# Patient Record
Sex: Female | Born: 1990 | Race: Black or African American | Hispanic: No | Marital: Single | Smoking: Current every day smoker
Health system: Southern US, Community
[De-identification: ages and names within clinical notes are randomized; demographics above are authoritative.]

## PROBLEM LIST (undated history)

## (undated) HISTORY — PX: TONSILLECTOMY: SUR1361

---

## 2014-12-27 ENCOUNTER — Emergency Department (HOSPITAL_BASED_OUTPATIENT_CLINIC_OR_DEPARTMENT_OTHER): Payer: Medicaid Other

## 2014-12-27 ENCOUNTER — Encounter (HOSPITAL_BASED_OUTPATIENT_CLINIC_OR_DEPARTMENT_OTHER): Payer: Self-pay | Admitting: *Deleted

## 2014-12-27 ENCOUNTER — Emergency Department (HOSPITAL_BASED_OUTPATIENT_CLINIC_OR_DEPARTMENT_OTHER)
Admission: EM | Admit: 2014-12-27 | Discharge: 2014-12-27 | Disposition: A | Discharge status: Home / Self Care | Payer: Medicaid Other | Attending: Emergency Medicine | Admitting: Emergency Medicine

## 2014-12-27 DIAGNOSIS — R109 Unspecified abdominal pain: Secondary | ICD-10-CM

## 2014-12-27 DIAGNOSIS — Z3202 Encounter for pregnancy test, result negative: Secondary | ICD-10-CM | POA: Insufficient documentation

## 2014-12-27 DIAGNOSIS — R197 Diarrhea, unspecified: Secondary | ICD-10-CM | POA: Diagnosis not present

## 2014-12-27 DIAGNOSIS — R52 Pain, unspecified: Secondary | ICD-10-CM

## 2014-12-27 DIAGNOSIS — R1084 Generalized abdominal pain: Secondary | ICD-10-CM | POA: Diagnosis present

## 2014-12-27 DIAGNOSIS — Z720 Tobacco use: Secondary | ICD-10-CM | POA: Diagnosis not present

## 2014-12-27 LAB — COMPREHENSIVE METABOLIC PANEL
ALBUMIN: 4.1 g/dL (ref 3.5–5.2)
ALT: 18 U/L (ref 0–35)
AST: 17 U/L (ref 0–37)
Alkaline Phosphatase: 65 U/L (ref 39–117)
Anion gap: 6 (ref 5–15)
BUN: 12 mg/dL (ref 6–23)
CALCIUM: 9.1 mg/dL (ref 8.4–10.5)
CO2: 23 mmol/L (ref 19–32)
Chloride: 108 mEq/L (ref 96–112)
Creatinine, Ser: 0.67 mg/dL (ref 0.50–1.10)
GFR calc Af Amer: >90 mL/min (ref >90)
GFR calc non Af Amer: >90 mL/min (ref >90)
GLUCOSE: 90 mg/dL (ref 70–99)
Potassium: 3.3 mmol/L — ABNORMAL LOW (ref 3.5–5.1)
Sodium: 137 mmol/L (ref 135–145)
Total Bilirubin: 0.4 mg/dL (ref 0.3–1.2)
Total Protein: 7.9 g/dL (ref 6.0–8.3)

## 2014-12-27 LAB — CBC WITH DIFFERENTIAL/PLATELET
BASOS ABS: 0 10*3/uL (ref 0.0–0.1)
Basophils Relative: 0 % (ref 0–1)
EOS ABS: 0.2 10*3/uL (ref 0.0–0.7)
EOS PCT: 2 % (ref 0–5)
HCT: 44 % (ref 36.0–46.0)
Hemoglobin: 13.9 g/dL (ref 12.0–15.0)
LYMPHS PCT: 33 % (ref 12–46)
Lymphs Abs: 2.8 10*3/uL (ref 0.7–4.0)
MCH: 24.6 pg — ABNORMAL LOW (ref 26.0–34.0)
MCHC: 31.6 g/dL (ref 30.0–36.0)
MCV: 78 fL (ref 78.0–100.0)
Monocytes Absolute: 0.6 10*3/uL (ref 0.1–1.0)
Monocytes Relative: 7 % (ref 3–12)
Neutro Abs: 5 10*3/uL (ref 1.7–7.7)
Neutrophils Relative %: 58 % (ref 43–77)
PLATELETS: 280 10*3/uL (ref 150–400)
RBC: 5.64 MIL/uL — ABNORMAL HIGH (ref 3.87–5.11)
RDW: 14.4 % (ref 11.5–15.5)
WBC: 8.6 10*3/uL (ref 4.0–10.5)

## 2014-12-27 LAB — URINALYSIS, ROUTINE W REFLEX MICROSCOPIC
Bilirubin Urine: NEGATIVE
Glucose, UA: NEGATIVE mg/dL
Hgb urine dipstick: NEGATIVE
KETONES UR: NEGATIVE mg/dL
Nitrite: NEGATIVE
PH: 5.5 (ref 5.0–8.0)
Protein, ur: NEGATIVE mg/dL
Specific Gravity, Urine: 1.027 (ref 1.005–1.030)
Urobilinogen, UA: 0.2 mg/dL (ref 0.0–1.0)

## 2014-12-27 LAB — URINE MICROSCOPIC-ADD ON

## 2014-12-27 LAB — LIPASE, BLOOD: LIPASE: 25 U/L (ref 11–59)

## 2014-12-27 LAB — PREGNANCY, URINE: Preg Test, Ur: NEGATIVE

## 2014-12-27 MED ORDER — POTASSIUM CHLORIDE CRYS ER 20 MEQ PO TBCR
40.0000 meq | EXTENDED_RELEASE_TABLET | Freq: Once | ORAL | Status: AC
  Administered 2014-12-27: 40 meq via ORAL
  Filled 2014-12-27: qty 2

## 2014-12-27 NOTE — ED Notes (Signed)
Pt c/o abd pain n/v/d x 6 days

## 2014-12-27 NOTE — ED Notes (Signed)
Patient transported to X-ray 

## 2014-12-27 NOTE — ED Provider Notes (Signed)
CSN: [id number]     Arrival date & time 12/27/14  1605 History   First MD Initiated Contact with Patient 12/27/14 1615     Chief Complaint  Patient presents with  . Abdominal Pain     (Consider location/radiation/quality/duration/timing/severity/associated sxs/prior Treatment) HPI Presents with abdominal pain diffuse onset 5 days ago crampy last 5 minutes at a time comes approximate 3 or 4 times per day. No nausea or vomiting no anorexia no appetite changes no fever she's had 4 or 5 episodes of watery diarrhea today no diarrhea prior to today. No treatment prior to coming here. Pain is made worse with eating improved with lying supine no other associated symptoms pain is minimal at present. No recent travel or recent antibiotic use History reviewed. No pertinent past medical history. past medical history negative History reviewed. No pertinent past surgical history. surgical history adenoidectomy, uvulectomy History reviewed. No pertinent family history. History  Substance Use Topics  . Smoking status: Current Every Day Smoker -- 0.50 packs/day    Types: Cigarettes  . Smokeless tobacco: Not on file  . Alcohol Use: Not on file   occasional alcohol no illicit drug use OB History    No data available     Review of Systems  Constitutional: Negative.   HENT: Negative.   Respiratory: Negative.   Cardiovascular: Negative.   Gastrointestinal: Positive for abdominal pain and diarrhea.  Genitourinary:       Amenorrheic has implanted subcutaneous birth control device  Musculoskeletal: Negative.   Skin: Negative.   Neurological: Negative.   Psychiatric/Behavioral: Negative.   All other systems reviewed and are negative.     Allergies  Review of patient's allergies indicates no known allergies.  Home Medications   Prior to Admission medications   Not on File   BP 127/65 mmHg  Pulse 97  Temp(Src) 98.5 F (36.9 C) (Oral)  Resp 16  Ht 5' 9.5" (1.765 m)  Wt 295 lb (133.811 kg)   BMI 42.95 kg/m2  SpO2 100% Physical Exam  Constitutional: She appears well-developed and well-nourished.  HENT:  Head: Normocephalic and atraumatic.  Eyes: Conjunctivae are normal. Pupils are equal, round, and reactive to light.  Neck: Neck supple. No tracheal deviation present. No thyromegaly present.  Cardiovascular: Normal rate and regular rhythm.   No murmur heard. Pulmonary/Chest: Effort normal and breath sounds normal.  Abdominal: Soft. Bowel sounds are normal. She exhibits no distension. There is no tenderness.  Morbidly obese  Musculoskeletal: Normal range of motion. She exhibits no edema or tenderness.  Neurological: She is alert. Coordination normal.  Skin: Skin is warm and dry. No rash noted.  Psychiatric: She has a normal mood and affect.  Nursing note and vitals reviewed.   ED Course  Procedures (including critical care time) Labs Review Labs Reviewed  URINALYSIS, ROUTINE W REFLEX MICROSCOPIC  PREGNANCY, URINE    Imaging Review No results found.   EKG Interpretation None     5:40 PM patient resting comfortably. Asymptomatic. Results for orders placed or performed during the hospital encounter of 12/27/14  Urinalysis, Routine w reflex microscopic  Result Value Ref Range   Color, Urine YELLOW YELLOW   APPearance CLOUDY (A) CLEAR   Specific Gravity, Urine 1.027 1.005 - 1.030   pH 5.5 5.0 - 8.0   Glucose, UA NEGATIVE NEGATIVE mg/dL   Hgb urine dipstick NEGATIVE NEGATIVE   Bilirubin Urine NEGATIVE NEGATIVE   Ketones, ur NEGATIVE NEGATIVE mg/dL   Protein, ur NEGATIVE NEGATIVE mg/dL   Urobilinogen, UA  0.2 0.0 - 1.0 mg/dL   Nitrite NEGATIVE NEGATIVE   Leukocytes, UA TRACE (A) NEGATIVE  Pregnancy, urine  Result Value Ref Range   Preg Test, Ur NEGATIVE NEGATIVE  Comprehensive metabolic panel  Result Value Ref Range   Sodium 137 135 - 145 mmol/L   Potassium 3.3 (L) 3.5 - 5.1 mmol/L   Chloride 108 96 - 112 mEq/L   CO2 23 19 - 32 mmol/L   Glucose, Bld 90  70 - 99 mg/dL   BUN 12 6 - 23 mg/dL   Creatinine, Ser 4.090.67 0.50 - 1.10 mg/dL   Calcium 9.1 8.4 - 81.110.5 mg/dL   Total Protein 7.9 6.0 - 8.3 g/dL   Albumin 4.1 3.5 - 5.2 g/dL   AST 17 0 - 37 U/L   ALT 18 0 - 35 U/L   Alkaline Phosphatase 65 39 - 117 U/L   Total Bilirubin 0.4 0.3 - 1.2 mg/dL   GFR calc non Af Amer >90 >90 mL/min   GFR calc Af Amer >90 >90 mL/min   Anion gap 6 5 - 15  CBC with Differential  Result Value Ref Range   WBC 8.6 4.0 - 10.5 K/uL   RBC 5.64 (H) 3.87 - 5.11 MIL/uL   Hemoglobin 13.9 12.0 - 15.0 g/dL   HCT 91.444.0 78.236.0 - 95.646.0 %   MCV 78.0 78.0 - 100.0 fL   MCH 24.6 (L) 26.0 - 34.0 pg   MCHC 31.6 30.0 - 36.0 g/dL   RDW 21.314.4 08.611.5 - 57.815.5 %   Platelets 280 150 - 400 K/uL   Neutrophils Relative % 58 43 - 77 %   Neutro Abs 5.0 1.7 - 7.7 K/uL   Lymphocytes Relative 33 12 - 46 %   Lymphs Abs 2.8 0.7 - 4.0 K/uL   Monocytes Relative 7 3 - 12 %   Monocytes Absolute 0.6 0.1 - 1.0 K/uL   Eosinophils Relative 2 0 - 5 %   Eosinophils Absolute 0.2 0.0 - 0.7 K/uL   Basophils Relative 0 0 - 1 %   Basophils Absolute 0.0 0.0 - 0.1 K/uL  Lipase, blood  Result Value Ref Range   Lipase 25 11 - 59 U/L  Urine microscopic-add on  Result Value Ref Range   Squamous Epithelial / LPF FEW (A) RARE   WBC, UA 0-2 <3 WBC/hpf   Bacteria, UA RARE RARE   Dg Abd Acute W/chest  12/27/2014   CLINICAL DATA:  Patient states that her stomach has been bothering her x 6 days; worse when she eats; diarrhea but patient says no vomiting  EXAM: ACUTE ABDOMEN SERIES (ABDOMEN 2 VIEW & CHEST 1 VIEW)  COMPARISON:  None.  FINDINGS: There is no evidence of dilated bowel loops or free intraperitoneal air. No radiopaque calculi or other significant radiographic abnormality is seen. Heart size and mediastinal contours are within normal limits. Both lungs are clear.  IMPRESSION: Negative abdominal radiographs.  No acute cardiopulmonary disease.   Electronically Signed   By: Elige KoHetal  Patel   On: 12/27/2014 16:58     MDM  Suggest Maalox, Zantac avoid dairy products all having diarrhea as pain worse with eating. No signs of cholecystitis.  Referral to resource guide to get primary care physician. Diagnosis #1 abdominal pain #2 diarrhea #3 hypokalemia Final diagnoses:  None        Doug SouSam Noa Galvao, MD 12/27/14 1751

## 2014-12-27 NOTE — Discharge Instructions (Signed)
Abdominal Pain, Women Take Maalox 2 tablespoons after meals and at bedtime. He can also use Zantac as directed. Call any of the numbers on the resource guide to get a primary care physician and to arrange to be seen if having significant discomfort in one week. Avoid milk or foods containing milk such as cheese or ice cream while having diarrhea Abdominal (stomach, pelvic, or belly) pain can be caused by many things. It is important to tell your doctor:  The location of the pain.  Does it come and go or is it present all the time?  Are there things that start the pain (eating certain foods, exercise)?  Are there other symptoms associated with the pain (fever, nausea, vomiting, diarrhea)? All of this is helpful to know when trying to find the cause of the pain. CAUSES   Stomach: virus or bacteria infection, or ulcer.  Intestine: appendicitis (inflamed appendix), regional ileitis (Crohn's disease), ulcerative colitis (inflamed colon), irritable bowel syndrome, diverticulitis (inflamed diverticulum of the colon), or cancer of the stomach or intestine.  Gallbladder disease or stones in the gallbladder.  Kidney disease, kidney stones, or infection.  Pancreas infection or cancer.  Fibromyalgia (pain disorder).  Diseases of the female organs:  Uterus: fibroid (non-cancerous) tumors or infection.  Fallopian tubes: infection or tubal pregnancy.  Ovary: cysts or tumors.  Pelvic adhesions (scar tissue).  Endometriosis (uterus lining tissue growing in the pelvis and on the pelvic organs).  Pelvic congestion syndrome (female organs filling up with blood just before the menstrual period).  Pain with the menstrual period.  Pain with ovulation (producing an egg).  Pain with an IUD (intrauterine device, birth control) in the uterus.  Cancer of the female organs.  Functional pain (pain not caused by a disease, may improve without treatment).  Psychological  pain.  Depression. DIAGNOSIS  Your doctor will decide the seriousness of your pain by doing an examination.  Blood tests.  X-rays.  Ultrasound.  CT scan (computed tomography, special type of X-ray).  MRI (magnetic resonance imaging).  Cultures, for infection.  Barium enema (dye inserted in the large intestine, to better view it with X-rays).  Colonoscopy (looking in intestine with a lighted tube).  Laparoscopy (minor surgery, looking in abdomen with a lighted tube).  Major abdominal exploratory surgery (looking in abdomen with a large incision). TREATMENT  The treatment will depend on the cause of the pain.   Many cases can be observed and treated at home.  Over-the-counter medicines recommended by your caregiver.  Prescription medicine.  Antibiotics, for infection.  Birth control pills, for painful periods or for ovulation pain.  Hormone treatment, for endometriosis.  Nerve blocking injections.  Physical therapy.  Antidepressants.  Counseling with a psychologist or psychiatrist.  Minor or major surgery. HOME CARE INSTRUCTIONS   Do not take laxatives, unless directed by your caregiver.  Take over-the-counter pain medicine only if ordered by your caregiver. Do not take aspirin because it can cause an upset stomach or bleeding.  Try a clear liquid diet (broth or water) as ordered by your caregiver. Slowly move to a bland diet, as tolerated, if the pain is related to the stomach or intestine.  Have a thermometer and take your temperature several times a day, and record it.  Bed rest and sleep, if it helps the pain.  Avoid sexual intercourse, if it causes pain.  Avoid stressful situations.  Keep your follow-up appointments and tests, as your caregiver orders.  If the pain does not go away  with medicine or surgery, you may try:  Acupuncture.  Relaxation exercises (yoga, meditation).  Group therapy.  Counseling. SEEK MEDICAL CARE IF:   You  notice certain foods cause stomach pain.  Your home care treatment is not helping your pain.  You need stronger pain medicine.  You want your IUD removed.  You feel faint or lightheaded.  You develop nausea and vomiting.  You develop a rash.  You are having side effects or an allergy to your medicine. SEEK IMMEDIATE MEDICAL CARE IF:   Your pain does not go away or gets worse.  You have a fever.  Your pain is felt only in portions of the abdomen. The right side could possibly be appendicitis. The left lower portion of the abdomen could be colitis or diverticulitis.  You are passing blood in your stools (bright red or black tarry stools, with or without vomiting).  You have blood in your urine.  You develop chills, with or without a fever.  You pass out. MAKE SURE YOU:   Understand these instructions.  Will watch your condition.  Will get help right away if you are not doing well or get worse. Document Released: 09/20/2007 Document Revised: 04/09/2014 Document Reviewed: 10/10/2009 [facility] Patient Information 2015 [place], [place]. This information is not intended to replace advice given to you by your health care provider. Make sure you discuss any questions you have with your health care provider.  Emergency Department Resource Guide 1) Find a Doctor and Pay Out of Pocket Although you won't have to find out who is covered by your insurance plan, it is a good idea to ask around and get recommendations. You will then need to call the office and see if the doctor you have chosen will accept you as a new patient and what types of options they offer for patients who are self-pay. Some doctors offer discounts or will set up payment plans for their patients who do not have insurance, but you will need to ask so you aren't surprised when you get to your appointment.  2) Contact Your Local Health Department Not all health departments have doctors that can see patients for sick  visits, but many do, so it is worth a call to see if yours does. If you don't know where your local health department is, you can check in your phone book. The CDC also has a tool to help you locate your state's health department, and many state websites also have listings of all of their local health departments.  3) Find a Walk-in Clinic If your illness is not likely to be very severe or complicated, you may want to try a walk in clinic. These are popping up all over the country in pharmacies, drugstores, and shopping centers. They're usually staffed by nurse practitioners or physician assistants that have been trained to treat common illnesses and complaints. They're usually fairly quick and inexpensive. However, if you have serious medical issues or chronic medical problems, these are probably not your best option.  No Primary Care Doctor: - Call Health Connect at  [phone] - they can help you locate a primary care doctor that  accepts your insurance, provides certain services, etc. - Physician Referral Service- [phone]  Chronic Pain Problems: Organization         Address  Phone   Notes  [facility]  [phone] Patients need to be referred by their primary care doctor.   Medication Assistance: Organization  Address  Phone   Notes  [facility] Medication [facility] [address] [place], [place] [address] [phone] --Must be a resident of [facility] -- Must have NO insurance coverage whatsoever (no Medicaid/ Medicare, etc.) -- The pt. MUST have a primary care doctor that directs their care regularly and follows them in the community   [facility]ist  [phone]   [employer]  [phone]    Agencies that provide inexpensive medical care: Organization         Address  Phone   Notes  [place]  [phone]   [facility]    [phone]   [facility] [place], [place] [address] [phone]   [place] [address] [place] [phone]   Planned Parenthood    [phone]   [facility]    [phone]   [place] and [address], [place] Phone:  [phone], Fax:  [phone] Hours of Operation:  9 am - 6 pm, M-F.  Also accepts Medicaid/Medicare and self-pay.  [facility] for [place] [address], [address], [place] Phone: [phone], Fax: [phone]. Hours of Operation:  8:30 am - 5:30 pm, M-F.  Also accepts Medicaid and self-pay.  [facility] [address], [place] Phone: [phone]   [place], [place], [place] [phone], Ext. [phone] Mondays & Thursdays: 7-9 AM.  First 15 patients are seen on a first come, first serve basis.    [place] Providers:  Organization         Address  Phone   Notes  [facility] [address], [place] [phone] Also accepts self-pay patients.  [facility] [address], [place]  [phone]   [place], [address], [place] [phone]   [facility] [address], [place] [phone]   Lucianne Lei [address], [place]   [phone] Only accepts [place] Access [place] patients after they have their name applied to their card.   Self-Pay (no insurance) in [facility]:  Organization         Address  Phone   Notes  Sickle Cell Patients, [facility] [place] [phone]   [facility] Urgent Care [place] [phone]   [facility] [place]  [place], [place], [place] [phone]   [facility]/Dr. Osei-Bonsu  [address]  [place] or [address], [place] [phone] Phone number for both [place] and [place] locations is the same.  Urgent Medical and [facility] [address] [place] [phone]   [facility] [address] [place] or [address] [phone]   [facility] [address] [place] [phone], phone; [phone], fax Sees patients 1st and 3rd Saturday of every month.  Must not qualify for public or private insurance (i.e. Medicaid, Medicare, [place] Health Choice, Veterans' Benefits)  Household income should be no more than 200% of the poverty level The clinic cannot treat you if you are pregnant or think you  are pregnant  Sexually transmitted diseases are not treated at the clinic.    Dental Care: Organization         Address  Phone  Notes  [facility] Department of [facility] [place] [phone] Accepts children up to age 60 who are enrolled in [place] or [place]; pregnant women with a Medicaid card; and children who have applied for Medicaid or [place] Health Choice, but were declined, whose parents can pay a reduced fee at time of service.  [facility] Department of [facility]  [address], [place] [phone] Accepts children up to age 43 who are enrolled in [place] or [place]; pregnant women with a Medicaid card; and children who have applied for Medicaid or [place] Health Choice, but were declined, whose parents can pay a reduced fee at time of service.  [place] Adult Dental Access PROGRAM  [place] [phone] Patients are seen by appointment only. Walk-ins are not accepted. [place] will see patients 47 years of age and older. Monday - Tuesday (8am-5pm) Most Wednesdays (8:30-5pm) $30 per visit, cash only  [facility] Adult Dental Access PROGRAM  [address]  Dr, [facility] [phone] Patients are seen by appointment only. Walk-ins are not accepted. [place] will see patients 27 years of age and older. One Wednesday Evening (Monthly: Volunteer Based).  $30 per visit, cash only  [place]  [phone] for adults; Children under age 24, call Graduate Pediatric Dentistry at [phone]. Children aged 14-14, please call [phone] to request a pediatric application.  Dental services are provided in all areas of dental care including fillings, crowns and bridges, complete and partial dentures, implants, gum treatment, root canals, and extractions. Preventive care is also provided. Treatment is provided to both adults and children. Patients are selected via a lottery and there is often a waiting list.   [facility] [address], [place]  [phone] [email or web address]   [facility] [address] [place], [place] [phone], Ext. [phone] Second and Fourth Thursday of each month, opens at 6:30 AM; Clinic ends at 9 AM.  Patients are seen on a first-come first-served basis, and a limited number are seen during each clinic.   [facility]  [address] Hillard Danker [place], [place] [phone]   Eligibility Requirements You must have lived in [place], [place], or [place] counties for at least the last three months.   You cannot be eligible for state or federal sponsored [employer], including [employer], [place], or [employer].   You generally cannot be eligible for healthcare insurance through your employer.    How to apply: Eligibility screenings are held every Tuesday and Wednesday afternoon from 1:00 pm until 4:00 pm. You do not need an appointment for the interview!  [facility] [address] [place], [place]   [place]  [place] Department  [place]  [phone]    Behavioral Health Resources in the Community: Intensive Outpatient Programs Organization         Address  Phone  Notes  [facility] [place]. [address] [place], [place] [phone]   [facility] Outpatient [address] [place], [place]   ADS: Alcohol & Drug Svcs [address]  Dr, [place], [place]   [place] [address],  [place], [place] or [phone]   Substance Abuse Resources Organization         Address  Phone  Notes  Alcohol and Drug Services  [phone]   [place]  [phone]   [facility]   Chinita Pester  [phone]   Residential & Outpatient Substance Abuse Program  [phone]   Psychological Services Organization         Address  Phone  Notes  [facility]  [place]  [phone]   [place] [address], [place] or [phone]    Mobile Crisis Teams Organization         Address  Phone  Notes  [facility], Mobile Crisis Care Unit  [phone]   [facility]  [address] [place], [place]   [place] [address], [place] [phone]    Self-Help/Support Groups Organization         Address  Phone             Notes  [place]. of [place] - variety of support groups  [place] Call for more information  Narcotics Anonymous (NA), [facility] [address], [employer] [place]  2 meetings at this location   Special educational needs teacher         Address  Phone  Notes  [facility]AP Residential Treatment [place],    [place]  [phone]   [facility]  [address] [place] [address], [place], [place]   Ewing Worden, [place]  [phone] Admissions: 8am-3pm M-F  Incentives Substance [place] [address],    [place], [place] [phone]   [facility] [address] [place], [place], [place]   [facility] [address]  [place], [place]   [facility] - Intensive Outpatient [address] Kristeen [address], [place], [place]   [facility] ([place] [address].,  [place], [place] [phone] or [phone]   [facility] Services (RT[facility]) [address], [place], [place] Accepts Medicaid  [facility] [address]  [place] [phone] Substance Abuse/Addiction Treatment   [facility] Organization         Address  Phone  Notes  [facility]  [phone]   Domenic Schwab, PhD [address] Arlis Porta [place], [place]   [phone] or [phone]   [place], [place] [phone]   [facility] [address] [place], [place] [phone] Insurance/Medicaid/sponsorship through [facility] and Families [address]                                    [place], [place] [phone] [place] [address] [place], [place] [phone]    Dr. Adele Schilder  [phone]   [facility] Dept. 1) [address] [place] 2) [place] 3)  [place] [address], [place] [phone]  [phone]   [place] [phone])  [phone] or [phone] (After Hours)

## 2015-10-28 ENCOUNTER — Encounter (HOSPITAL_BASED_OUTPATIENT_CLINIC_OR_DEPARTMENT_OTHER): Payer: Self-pay | Admitting: *Deleted

## 2015-10-28 ENCOUNTER — Emergency Department (HOSPITAL_BASED_OUTPATIENT_CLINIC_OR_DEPARTMENT_OTHER): Payer: Medicaid Other

## 2015-10-28 ENCOUNTER — Emergency Department (HOSPITAL_BASED_OUTPATIENT_CLINIC_OR_DEPARTMENT_OTHER)
Admission: EM | Admit: 2015-10-28 | Discharge: 2015-10-28 | Disposition: A | Discharge status: Home / Self Care | Payer: Medicaid Other | Attending: Emergency Medicine | Admitting: Emergency Medicine

## 2015-10-28 DIAGNOSIS — Z3202 Encounter for pregnancy test, result negative: Secondary | ICD-10-CM | POA: Diagnosis not present

## 2015-10-28 DIAGNOSIS — R05 Cough: Secondary | ICD-10-CM | POA: Insufficient documentation

## 2015-10-28 DIAGNOSIS — R079 Chest pain, unspecified: Secondary | ICD-10-CM | POA: Insufficient documentation

## 2015-10-28 DIAGNOSIS — Z880 Allergy status to penicillin: Secondary | ICD-10-CM | POA: Insufficient documentation

## 2015-10-28 DIAGNOSIS — R0989 Other specified symptoms and signs involving the circulatory and respiratory systems: Secondary | ICD-10-CM | POA: Diagnosis not present

## 2015-10-28 DIAGNOSIS — F17210 Nicotine dependence, cigarettes, uncomplicated: Secondary | ICD-10-CM | POA: Insufficient documentation

## 2015-10-28 DIAGNOSIS — R059 Cough, unspecified: Secondary | ICD-10-CM

## 2015-10-28 LAB — PREGNANCY, URINE: Preg Test, Ur: NEGATIVE

## 2015-10-28 MED ORDER — BENZONATATE 100 MG PO CAPS: 100.0000 mg | ORAL_CAPSULE | Freq: Two times a day (BID) | ORAL | Status: DC | PRN

## 2015-10-28 NOTE — ED Provider Notes (Signed)
CSN: [id number]     Arrival date & time 10/28/15  [id number] History   First MD Initiated Contact with Patient 10/28/15 [phone]     Chief Complaint  Patient presents with  . Cough     (Consider location/radiation/quality/duration/timing/severity/associated sxs/prior Treatment) HPI 24 year old female who presents with cough for 3-5 days. Says she feels normal otherwise, but coughing consistently with chest wall soreness. Having significant amount of brown mucous and chest congestion. Continues to smoke while this is ongoing. Denies N/V, fevers, night sweats, diarrhea, abdominal pain, dysuria. Works [employer], so exposed to sick contacts likely per patient. No recent travel.    History reviewed. No pertinent past medical history. History reviewed. No pertinent past surgical history. History reviewed. No pertinent family history. Social History  Substance Use Topics  . Smoking status: Current Every Day Smoker -- 0.50 packs/day    Types: Cigarettes  . Smokeless tobacco: None  . Alcohol Use: None   OB History    No data available     Review of Systems 10/14 systems reviewed and are negative other than those stated in the HPI    Allergies  Penicillins and Zithromax  Home Medications   Prior to Admission medications   Medication Sig Start Date End Date Taking? Authorizing Provider  benzonatate (TESSALON PERLES) 100 MG capsule Take 1 capsule (100 mg total) by mouth 2 (two) times daily as needed for cough. 10/28/15   Lavera Guiseana Duo Arlan Birks, MD   BP 119/71 mmHg  Pulse 71  Temp(Src) 98.7 F (37.1 C) (Oral)  Resp 18  Ht 5' 9.5" (1.765 m)  Wt 302 lb (136.986 kg)  BMI 43.97 kg/m2  SpO2 100% Physical Exam Physical Exam  Nursing note and vitals reviewed. Constitutional: Well developed, well nourished, non-toxic, and in no acute distress Head: Normocephalic and atraumatic.  Mouth/Throat: Oropharynx is clear and moist.  Neck: Normal range of motion. Neck supple.  Cardiovascular:  Normal rate and regular rhythm.   Pulmonary/Chest: Effort normal and breath sounds normal. Anterior chest wall tenderness to palpation. Abdominal: Soft. There is no tenderness. There is no rebound and no guarding.  Musculoskeletal: Normal range of motion.  Neurological: Alert, no facial droop, fluent speech, moves all extremities symmetrically Skin: Skin is warm and dry.  Psychiatric: Cooperative  ED Course  Procedures (including critical care time) Labs Review Labs Reviewed  PREGNANCY, URINE    Imaging Review Dg Chest 2 View  10/28/2015  CLINICAL DATA:  Cough, congestion for 1 week EXAM: CHEST  2 VIEW COMPARISON:  09/05/2015 FINDINGS: Cardiomediastinal silhouette is stable. No acute infiltrate or pleural effusion. No pulmonary edema. Bony thorax is unremarkable. IMPRESSION: No active cardiopulmonary disease. Electronically Signed   By: Natasha MeadLiviu  Pop M.D.   On: 10/28/2015 09:25   I have personally reviewed and evaluated these images and lab results as part of my medical decision-making.   EKG Interpretation   Date/Time:  Monday October 28 2015 08:23:48 EST Ventricular Rate:  83 PR Interval:  152 QRS Duration: 86 QT Interval:  360 QTC Calculation: 423 R Axis:   62 Text Interpretation:  Normal sinus rhythm Normal ECG Confirmed by Erandy Mceachern MD,  Annabelle HarmanANA ([id number]) on 10/28/2015 9:47:14 AM      MDM   Final diagnoses:  Cough      24 year old female who presents with 3-5 days of cough, congestion, sputum production, and chest wall discomfort. Well-appearing and in no acute distress. Breathing comfortably on room air with no conversational dyspnea. Vital signs are  within normal limits. Lungs are clear to auscultation bilaterally, and she has some reproducible chest wall discomfort anteriorly. PERC negative, and I'm not concerned for PE. Chest x-ray showing no acute cardiopulmonary processes. Likely viral respiratory illness in nature. Discussed supportive care instructions for home. Strict  return and follow-up instructions are reviewed. She expressed understanding of all discharge instructions and felt comfortable to plan of care.   Lavera Guise, MD 10/28/15 [phone]

## 2015-10-28 NOTE — ED Notes (Signed)
Dr. Verdie MosherLiu at bedside to discuss d/c with patient. Pt requested medication for cough and EDP brought rx for tessalon. States "I don't even want that" and threw rx to the side. Reviewed d/c instructions with pt including importance of not smoking. Pt given d/c resource guide for followup.

## 2015-10-28 NOTE — ED Notes (Signed)
Patient transported to X-ray 

## 2015-10-28 NOTE — ED Notes (Signed)
MD at bedside. 

## 2015-10-28 NOTE — ED Notes (Signed)
Pt reports cough x 3 days, denies any fevers, coughing up brownish sputum, pt is daily smoker. Denies any nasal congestion, states she had pneumonia in September.

## 2015-10-28 NOTE — Discharge Instructions (Signed)
Your chest x-ray does not show a pneumonia. It is likely a virus that is causing her symptoms. Smoking is likely making her symptoms worse, so please cut down or quit if possible. Your symptoms may last up to 4 weeks. Return without fail for worsening symptoms including difficulty breathing, worsening pain, fever or any other symptoms concerning to you.  Cough, Adult A cough helps to clear your throat and lungs. A cough may last only 2-3 weeks (acute), or it may last longer than 8 weeks (chronic). Many different things can cause a cough. A cough may be a sign of an illness or another medical condition. HOME CARE  Pay attention to any changes in your cough.  Take medicines only as told by your doctor.  If you were prescribed an antibiotic medicine, take it as told by your doctor. Do not stop taking it even if you start to feel better.  Talk with your doctor before you try using a cough medicine.  Drink enough fluid to keep your pee (urine) clear or pale yellow.  If the air is dry, use a cold steam vaporizer or humidifier in your home.  Stay away from things that make you cough at work or at home.  If your cough is worse at night, try using extra pillows to raise your head up higher while you sleep.  Do not smoke, and try not to be around smoke. If you need help quitting, ask your doctor.  Do not have caffeine.  Do not drink alcohol.  Rest as needed. GET HELP IF:  You have new problems (symptoms).  You cough up yellow fluid (pus).  Your cough does not get better after 2-3 weeks, or your cough gets worse.  Medicine does not help your cough and you are not sleeping well.  You have pain that gets worse or pain that is not helped with medicine.  You have a fever.  You are losing weight and you do not know why.  You have night sweats. GET HELP RIGHT AWAY IF:  You cough up blood.  You have trouble breathing.  Your heartbeat is very fast.   This information is not  intended to replace advice given to you by your health care provider. Make sure you discuss any questions you have with your health care provider.   Document Released: 08/06/2011 Document Revised: 08/14/2015 Document Reviewed: 01/30/2015 Elsevier Interactive Patient Education [employer]2016 Elsevier Inc.

## 2015-10-28 NOTE — ED Notes (Signed)
RN at bedside for triage. 

## 2016-11-06 ENCOUNTER — Encounter (HOSPITAL_BASED_OUTPATIENT_CLINIC_OR_DEPARTMENT_OTHER): Payer: Self-pay | Admitting: Emergency Medicine

## 2016-11-06 ENCOUNTER — Emergency Department (HOSPITAL_BASED_OUTPATIENT_CLINIC_OR_DEPARTMENT_OTHER)
Admission: EM | Admit: 2016-11-06 | Discharge: 2016-11-06 | Disposition: A | Discharge status: Home / Self Care | Payer: Medicaid Other | Attending: Emergency Medicine | Admitting: Emergency Medicine

## 2016-11-06 ENCOUNTER — Emergency Department (HOSPITAL_BASED_OUTPATIENT_CLINIC_OR_DEPARTMENT_OTHER): Payer: Medicaid Other

## 2016-11-06 DIAGNOSIS — F17210 Nicotine dependence, cigarettes, uncomplicated: Secondary | ICD-10-CM | POA: Insufficient documentation

## 2016-11-06 DIAGNOSIS — M79671 Pain in right foot: Secondary | ICD-10-CM | POA: Diagnosis present

## 2016-11-06 DIAGNOSIS — M722 Plantar fascial fibromatosis: Secondary | ICD-10-CM | POA: Insufficient documentation

## 2016-11-06 MED ORDER — IBUPROFEN 800 MG PO TABS: 800.0000 mg | ORAL_TABLET | Freq: Three times a day (TID) | ORAL | 0 refills | Status: DC

## 2016-11-06 NOTE — ED Triage Notes (Signed)
Patient states that for the last 2- 3 weeks she has had pain to the arch of her right foot. Patient reports sharp pains

## 2016-11-06 NOTE — ED Provider Notes (Signed)
[facility] Provider Note   CSN: [id number] Arrival date & time: 11/06/16  1823  By signing my name below, I, Linna DarnerRussell Turner, attest that this documentation has been prepared under the direction and in the presence of non-physician practitioner, Buel ReamAlexandra Daniah Zaldivar, PA-C. Electronically Signed: Linna Darnerussell Turner, Scribe. 11/06/2016. 8:31 PM.  History   Chief Complaint Chief Complaint  Patient presents with  . Foot Pain    The history is provided by the patient. No language interpreter was used.     HPI Comments: Terri Robbins is a 25 y.o. female who presents to the Emergency Department complaining of gradual onset, constant, burning, aching, right foot pain for the last several weeks. She states the pain is most significant in her right heel. She reports associated swelling. Pt endorses pain exacerbation with standing, ambulating, and bearing weight on her right foot. She notes her pain is worse when her right foot is bare. Pt reports she stands on her feet often at work and has worn the same work shoes for a long time. No medications or treatments tried. She notes she saw a podiatrist about a year and half ago for the same reason; she states the pain resolved completely until a few weeks ago. She denies numbness/tingling, wounds, rash, color change, fever, chills, chest pain, SOB, nausea, vomiting, abdominal pain, or any other associated symptoms.  History reviewed. No pertinent past medical history.  There are no active problems to display for this patient.   History reviewed. No pertinent surgical history.  OB History    No data available       Home Medications    Prior to Admission medications   Medication Sig Start Date End Date Taking? Authorizing Provider  benzonatate (TESSALON PERLES) 100 MG capsule Take 1 capsule (100 mg total) by mouth 2 (two) times daily as needed for cough. 10/28/15   Lavera Guiseana Duo Liu, MD  ibuprofen (ADVIL,MOTRIN) 800 MG tablet Take 1 tablet (800  mg total) by mouth 3 (three) times daily. 11/06/16   Emi HolesAlexandra M Beckam Abdulaziz, PA-C    Family History History reviewed. No pertinent family history.  Social History Social History  Substance Use Topics  . Smoking status: Current Every Day Smoker    Packs/day: 0.50    Types: Cigarettes  . Smokeless tobacco: Never Used  . Alcohol use Not on file     Allergies   Penicillins and Zithromax [azithromycin]   Review of Systems Review of Systems  Constitutional: Negative for chills and fever.  Respiratory: Negative for shortness of breath.   Cardiovascular: Negative for chest pain.  Gastrointestinal: Negative for abdominal pain, nausea and vomiting.  Musculoskeletal: Positive for joint swelling (right foot) and myalgias (right foot). Negative for back pain.  Skin: Negative for color change, rash and wound.  Neurological: Negative for numbness.  Psychiatric/Behavioral: The patient is not nervous/anxious.      Physical Exam Updated Vital Signs BP 140/87 (BP Location: Right Arm)   Pulse 102   Temp 98.3 F (36.8 C) (Oral)   Resp 18   Ht 5\' 9"  (1.753 m)   Wt (!) 142.9 kg   LMP 09/06/2016   SpO2 100%   BMI 46.52 kg/m   Physical Exam  Constitutional: She appears well-developed and well-nourished. No distress.  HENT:  Head: Normocephalic and atraumatic.  Mouth/Throat: Oropharynx is clear and moist. No oropharyngeal exudate.  Eyes: Conjunctivae are normal. Pupils are equal, round, and reactive to light. Right eye exhibits no discharge. Left eye exhibits no discharge. No  scleral icterus.  Neck: Normal range of motion. Neck supple. No thyromegaly present.  Cardiovascular: Regular rhythm, normal heart sounds and intact distal pulses.  Exam reveals no gallop and no friction rub.   No murmur heard. Pulmonary/Chest: Effort normal and breath sounds normal. No stridor. No respiratory distress. She has no wheezes. She has no rales.  Abdominal: Soft. Bowel sounds are normal. She exhibits no  distension. There is no tenderness. There is no rebound and no guarding.  Musculoskeletal: She exhibits no edema.       Right foot: There is tenderness and swelling. There is no bony tenderness and normal capillary refill.       Feet:  R foot: Normal sensation, full range of motion of ankle and toes; tenderness at the insertion of the plantar fascia  Lymphadenopathy:    She has no cervical adenopathy.  Neurological: She is alert. Coordination normal.  Skin: Skin is warm and dry. No rash noted. She is not diaphoretic. No pallor.  Psychiatric: She has a normal mood and affect.  Nursing note and vitals reviewed.    ED Treatments / Results  Labs (all labs ordered are listed, but only abnormal results are displayed) Labs Reviewed - No data to display  EKG  EKG Interpretation None       Radiology Dg Foot Complete Right  Result Date: 11/06/2016 CLINICAL DATA:  Right foot pain with standing and walking. No known injury. EXAM: RIGHT FOOT COMPLETE - 3+ VIEW COMPARISON:  None. FINDINGS: The mineralization and alignment are normal. There is no evidence of acute fracture or dislocation. There is no periosteal thickening. The joint spaces are maintained. No focal soft tissue abnormalities are seen. IMPRESSION: Negative right foot radiographs. Electronically Signed   By: Carey BullocksWilliam  Veazey M.D.   On: 11/06/2016 19:15    Procedures Procedures (including critical care time)  DIAGNOSTIC STUDIES: Oxygen Saturation is 100% on RA, normal by my interpretation.    COORDINATION OF CARE: 8:40 PM Discussed treatment plan with pt at bedside and pt agreed to plan.  Medications Ordered in ED Medications - No data to display   Initial Impression / Assessment and Plan / ED Course  I have reviewed the triage vital signs and the nursing notes.  Pertinent labs & imaging results that were available during my care of the patient were reviewed by me and considered in my medical decision making (see chart  for details).  Clinical Course     Suspect plantar fasciitis. Negative foot x-ray. Discharge home with ibuprofen. Supportive treatment discussed including ice, massage, stretching, heel/arch inserts, arch supportive shoes. Patient advised follow-up with podiatry for further evaluation and orthotics. Return precautions discussed. Patient understands and agrees with plan. Patient vitals stable throughout ED course discharged in satisfactory condition.  I personally performed the services described in this documentation, which was scribed in my presence. The recorded information has been reviewed and is accurate.   Final Clinical Impressions(s) / ED Diagnoses   Final diagnoses:  Plantar fasciitis    New Prescriptions New Prescriptions   IBUPROFEN (ADVIL,MOTRIN) 800 MG TABLET    Take 1 tablet (800 mg total) by mouth 3 (three) times daily.     Emi Holeslexandra M Laasya Peyton, PA-C 11/06/16 2111    Doug SouSam Jacubowitz, MD 11/07/16 [phone]

## 2016-11-06 NOTE — Discharge Instructions (Signed)
Medications: Ibuprofen  Treatment: Take ibuprofen every 8 hours as needed for pain. Use ice, either a frozen water bottle or juice can, to massage her foot 3-4 times a day alternating 20 minutes on, 20 minutes off. Do the stretches we discussed and those outlined below 2-3 times daily. Make sure to wear shoes with good arch support. You can find over-the-counter insoles at a drugstore, [employer], [employer] that you can try if you're not able to see the podiatrist right away.  Follow-up: Please follow-up with podiatrist as soon as possible for further evaluation and treatment. Please return to emergency department if you develop any new or worsening symptoms.

## 2018-09-19 DIAGNOSIS — F17210 Nicotine dependence, cigarettes, uncomplicated: Secondary | ICD-10-CM | POA: Insufficient documentation

## 2018-09-19 DIAGNOSIS — E559 Vitamin D deficiency, unspecified: Secondary | ICD-10-CM | POA: Insufficient documentation

## 2018-11-08 ENCOUNTER — Emergency Department (HOSPITAL_BASED_OUTPATIENT_CLINIC_OR_DEPARTMENT_OTHER)
Admission: EM | Admit: 2018-11-08 | Discharge: 2018-11-08 | Disposition: A | Discharge status: Home / Self Care | Payer: Self-pay | Attending: Emergency Medicine | Admitting: Emergency Medicine

## 2018-11-08 ENCOUNTER — Encounter (HOSPITAL_BASED_OUTPATIENT_CLINIC_OR_DEPARTMENT_OTHER): Payer: Self-pay | Admitting: *Deleted

## 2018-11-08 ENCOUNTER — Other Ambulatory Visit: Payer: Self-pay

## 2018-11-08 DIAGNOSIS — F17210 Nicotine dependence, cigarettes, uncomplicated: Secondary | ICD-10-CM | POA: Insufficient documentation

## 2018-11-08 DIAGNOSIS — J111 Influenza due to unidentified influenza virus with other respiratory manifestations: Secondary | ICD-10-CM | POA: Insufficient documentation

## 2018-11-08 DIAGNOSIS — R69 Illness, unspecified: Secondary | ICD-10-CM

## 2018-11-08 MED ORDER — BENZONATATE 100 MG PO CAPS: 100.0000 mg | ORAL_CAPSULE | Freq: Three times a day (TID) | ORAL | 0 refills | Status: DC

## 2018-11-08 MED ORDER — ACETAMINOPHEN 325 MG PO TABS
650.0000 mg | ORAL_TABLET | Freq: Once | ORAL | Status: AC | PRN
  Administered 2018-11-08: 650 mg via ORAL
  Filled 2018-11-08: qty 2

## 2018-11-08 MED ORDER — IBUPROFEN 600 MG PO TABS: 600.0000 mg | ORAL_TABLET | Freq: Four times a day (QID) | ORAL | 0 refills | Status: DC | PRN

## 2018-11-08 MED ORDER — ACETAMINOPHEN 500 MG PO TABS: 500.0000 mg | ORAL_TABLET | Freq: Four times a day (QID) | ORAL | 0 refills | Status: AC | PRN

## 2018-11-08 NOTE — ED Triage Notes (Signed)
Fever and cough since yesterday.  

## 2018-11-08 NOTE — ED Notes (Signed)
ED Provider at bedside. 

## 2018-11-08 NOTE — Discharge Instructions (Signed)
You most likely have influenza or influenza-like illness.  This should run its course in 7 to 10 days.  Alternate ibuprofen and Tylenol as prescribed to help with your body aches, fever, and headaches.  Take Tessalon every 8 hours as needed for cough.  Make sure to drink plenty of water and get plenty of rest.  Please return the emergency department if you develop any new or worsening symptoms.

## 2018-11-08 NOTE — ED Provider Notes (Signed)
[facility] EMERGENCY DEPARTMENT Provider Note   CSN: [id number] Arrival date & time: 11/08/18  1238     History   Chief Complaint Chief Complaint  Patient presents with  . Fever  . Cough    HPI Terri Robbins is a 27 y.o. female who is previously healthy who presents with a 1 day history of fever and cough that began today.  The patient reports she had a fever 101.5 yesterday afternoon that hit her suddenly.  She has had associated body aches and headache.  She began having a nonproductive cough today.  She took ibuprofen with some relief.  Her child has had an upper respiratory illness at home.  She denies any chest pain, shortness of breath, abdominal pain, nausea, vomiting, diarrhea, sore throat, ear pain.  HPI  History reviewed. No pertinent past medical history.  There are no active problems to display for this patient.   History reviewed. No pertinent surgical history.   OB History   None      Home Medications    Prior to Admission medications   Medication Sig Start Date End Date Taking? Authorizing Provider  acetaminophen (TYLENOL) 500 MG tablet Take 1 tablet (500 mg total) by mouth every 6 (six) hours as needed. 11/08/18   Reshma Hoey, Waylan Boga, PA-C  benzonatate (TESSALON) 100 MG capsule Take 1 capsule (100 mg total) by mouth every 8 (eight) hours. 11/08/18   Rusty Glodowski, Waylan Boga, PA-C  ibuprofen (ADVIL,MOTRIN) 600 MG tablet Take 1 tablet (600 mg total) by mouth every 6 (six) hours as needed. 11/08/18   Emi Holes, PA-C    Family History No family history on file.  Social History Social History   Tobacco Use  . Smoking status: Current Every Day Smoker    Packs/day: 0.50    Types: Cigarettes  . Smokeless tobacco: Never Used  Substance Use Topics  . Alcohol use: Yes  . Drug use: No     Allergies   Penicillins and Zithromax [azithromycin]   Review of Systems Review of Systems  Constitutional: Positive for chills and fever.  HENT:  Negative for congestion, ear pain, facial swelling and sore throat.   Respiratory: Positive for cough. Negative for shortness of breath.   Cardiovascular: Negative for chest pain.  Gastrointestinal: Negative for abdominal pain, nausea and vomiting.  Genitourinary: Negative for dysuria.  Musculoskeletal: Positive for myalgias. Negative for back pain and neck pain.  Skin: Negative for rash and wound.  Neurological: Positive for headaches.  Psychiatric/Behavioral: The patient is not nervous/anxious.      Physical Exam Updated Vital Signs BP 119/76 (BP Location: Left Arm)   Pulse 95   Temp 99.4 F (37.4 C) (Oral)   Resp 16   Ht 5\' 9"  (1.753 m)   Wt (!) 148.8 kg   SpO2 100%   BMI 48.44 kg/m   Physical Exam  Constitutional: She appears well-developed and well-nourished. No distress.  HENT:  Head: Normocephalic and atraumatic.  Right Ear: Tympanic membrane normal.  Left Ear: Tympanic membrane normal.  Mouth/Throat: Oropharynx is clear and moist. No oropharyngeal exudate.  Eyes: Pupils are equal, round, and reactive to light. Conjunctivae are normal. Right eye exhibits no discharge. Left eye exhibits no discharge. No scleral icterus.  Neck: Normal range of motion. Neck supple. No neck rigidity. No thyromegaly present.  Cardiovascular: Normal rate, regular rhythm, normal heart sounds and intact distal pulses. Exam reveals no gallop and no friction rub.  No murmur heard. Pulmonary/Chest: Effort normal and  breath sounds normal. No stridor. No respiratory distress. She has no wheezes. She has no rales.  Abdominal: Soft. Bowel sounds are normal. She exhibits no distension. There is no tenderness. There is no rebound and no guarding.  Musculoskeletal: She exhibits no edema.  Lymphadenopathy:    She has no cervical adenopathy.  Neurological: She is alert. Coordination normal.  Skin: Skin is warm and dry. No rash noted. She is not diaphoretic. No pallor.  Psychiatric: She has a normal mood  and affect.  Nursing note and vitals reviewed.    ED Treatments / Results  Labs (all labs ordered are listed, but only abnormal results are displayed) Labs Reviewed - No data to display  EKG None  Radiology No results found.  Procedures Procedures (including critical care time)  Medications Ordered in ED Medications  acetaminophen (TYLENOL) tablet 650 mg (650 mg Oral Given 11/08/18 1417)     Initial Impression / Assessment and Plan / ED Course  I have reviewed the triage vital signs and the nursing notes.  Pertinent labs & imaging results that were available during my care of the patient were reviewed by me and considered in my medical decision making (see chart for details).     Patient with symptoms consistent with influenza.  Vitals are stable, low-grade fever.  No signs of dehydration, tolerating PO's.  Lungs are clear. Due to patient's presentation and physical exam a chest x-ray was not ordered bc likely diagnosis of flu.  Discussed the cost/side effects versus benefit of Tamiflu treatment with the patient. She declines. Patient will be discharged with instructions to orally hydrate, rest, and use over-the-counter medications such as anti-inflammatories ibuprofen and Aleve for muscle aches and Tylenol for fever.  Patient will also be given a cough suppressant.  Return precautions discussed.  Patient understands and agrees with plan.  Patient vitals stable throughout ED course and discharged in satisfactory condition.   Final Clinical Impressions(s) / ED Diagnoses   Final diagnoses:  Influenza-like illness    ED Discharge Orders         Ordered    benzonatate (TESSALON) 100 MG capsule  Every 8 hours     11/08/18 1437    ibuprofen (ADVIL,MOTRIN) 600 MG tablet  Every 6 hours PRN     11/08/18 1437    acetaminophen (TYLENOL) 500 MG tablet  Every 6 hours PRN     11/08/18 1437           Emi HolesLaw, Issis Lindseth M, PA-C 11/08/18 1450    Vanetta MuldersZackowski, Scott, MD 11/12/18  1948

## 2018-12-21 ENCOUNTER — Encounter (HOSPITAL_BASED_OUTPATIENT_CLINIC_OR_DEPARTMENT_OTHER): Payer: Self-pay

## 2018-12-21 ENCOUNTER — Other Ambulatory Visit: Payer: Self-pay

## 2018-12-21 ENCOUNTER — Emergency Department (HOSPITAL_BASED_OUTPATIENT_CLINIC_OR_DEPARTMENT_OTHER)
Admission: EM | Admit: 2018-12-21 | Discharge: 2018-12-21 | Disposition: A | Discharge status: Home / Self Care | Payer: Self-pay | Attending: Emergency Medicine | Admitting: Emergency Medicine

## 2018-12-21 ENCOUNTER — Emergency Department (HOSPITAL_BASED_OUTPATIENT_CLINIC_OR_DEPARTMENT_OTHER): Payer: Self-pay

## 2018-12-21 DIAGNOSIS — F17210 Nicotine dependence, cigarettes, uncomplicated: Secondary | ICD-10-CM | POA: Insufficient documentation

## 2018-12-21 DIAGNOSIS — R0789 Other chest pain: Secondary | ICD-10-CM | POA: Insufficient documentation

## 2018-12-21 LAB — CBC WITH DIFFERENTIAL/PLATELET
Abs Immature Granulocytes: 0.02 10*3/uL (ref 0.00–0.07)
Basophils Absolute: 0 10*3/uL (ref 0.0–0.1)
Basophils Relative: 0 %
Eosinophils Absolute: 0.2 10*3/uL (ref 0.0–0.5)
Eosinophils Relative: 2 %
HCT: 41.8 % (ref 36.0–46.0)
Hemoglobin: 12.5 g/dL (ref 12.0–15.0)
Immature Granulocytes: 0 %
Lymphocytes Relative: 36 %
Lymphs Abs: 3.1 10*3/uL (ref 0.7–4.0)
MCH: 25.5 pg — ABNORMAL LOW (ref 26.0–34.0)
MCHC: 29.9 g/dL — ABNORMAL LOW (ref 30.0–36.0)
MCV: 85.3 fL (ref 80.0–100.0)
Monocytes Absolute: 0.6 10*3/uL (ref 0.1–1.0)
Monocytes Relative: 7 %
Neutro Abs: 4.8 10*3/uL (ref 1.7–7.7)
Neutrophils Relative %: 55 %
Platelets: 242 10*3/uL (ref 150–400)
RBC: 4.9 MIL/uL (ref 3.87–5.11)
RDW: 14.2 % (ref 11.5–15.5)
WBC: 8.7 10*3/uL (ref 4.0–10.5)
nRBC: 0 % (ref 0.0–0.2)

## 2018-12-21 LAB — BASIC METABOLIC PANEL
Anion gap: 6 (ref 5–15)
BUN: 13 mg/dL (ref 6–20)
CO2: 25 mmol/L (ref 22–32)
Calcium: 8.8 mg/dL — ABNORMAL LOW (ref 8.9–10.3)
Chloride: 105 mmol/L (ref 98–111)
Creatinine, Ser: 0.67 mg/dL (ref 0.44–1.00)
GFR calc Af Amer: >60 mL/min (ref >60)
GFR calc non Af Amer: >60 mL/min (ref >60)
Glucose, Bld: 87 mg/dL (ref 70–99)
Potassium: 3.8 mmol/L (ref 3.5–5.1)
Sodium: 136 mmol/L (ref 135–145)

## 2018-12-21 LAB — PREGNANCY, URINE: Preg Test, Ur: NEGATIVE

## 2018-12-21 LAB — TROPONIN I: Troponin I: <0.03 ng/mL (ref <0.03)

## 2018-12-21 MED ORDER — IBUPROFEN 800 MG PO TABS: 800.0000 mg | ORAL_TABLET | Freq: Three times a day (TID) | ORAL | 0 refills | Status: DC | PRN

## 2018-12-21 MED ORDER — SUCRALFATE 1 G PO TABS: 1.0000 g | ORAL_TABLET | Freq: Three times a day (TID) | ORAL | 0 refills | Status: DC

## 2018-12-21 NOTE — ED Notes (Signed)
Urine sample in lab if needed.  

## 2018-12-21 NOTE — ED Notes (Signed)
Pt voiced wanting to leave facility. RN in to room to remove IV and disconnect from monitor. Pt yelling at RN stating "whats taking so long, yall just letting me sit here". I explained to patient that she was waiting on PCP to review results with her.

## 2018-12-21 NOTE — ED Notes (Signed)
Pt denies any other symptoms. Pt ate lasagna yesterday for lunch and central chest pain began last night. Pt had to sleep sitting up. Pt denies N/V/fevers.

## 2018-12-21 NOTE — Discharge Instructions (Addendum)
Your testing did not show any abnormalities with your heart or lungs.  Return here as needed.  Follow-up with a primary doctor.

## 2018-12-21 NOTE — ED Triage Notes (Signed)
Pt c/o central chest pain since last night, denies SOB, denies dizziness, denies nausea, denies radiation

## 2018-12-22 NOTE — ED Provider Notes (Signed)
[facility] EMERGENCY DEPARTMENT Provider Note   CSN: [id number] Arrival date & time: 12/21/18  1547     History   Chief Complaint Chief Complaint  Patient presents with  . Chest Pain    HPI Terri Robbins is a 28 y.o. female.  HPI Patient presents to the emergency department with chest pain that started last night.  The patient states that she has a constant pressure in her chest.  She states she did eat some spicy [place] food last night and then started having the symptoms later in the day.  She states she had to sleep sitting up to help with the discomfort.  The patient denies shortness of breath, headache,blurred vision, neck pain, fever, cough, weakness, numbness, dizziness, anorexia, edema, abdominal pain, nausea, vomiting, diarrhea, rash, back pain, dysuria, hematemesis, bloody stool, near syncope, or syncope. History reviewed. No pertinent past medical history.  There are no active problems to display for this patient.   Past Surgical History:  Procedure Laterality Date  . TONSILLECTOMY       OB History   No obstetric history on file.      Home Medications    Prior to Admission medications   Medication Sig Start Date End Date Taking? Authorizing Provider  acetaminophen (TYLENOL) 500 MG tablet Take 1 tablet (500 mg total) by mouth every 6 (six) hours as needed. 11/08/18   Law, Waylan Boga, PA-C  benzonatate (TESSALON) 100 MG capsule Take 1 capsule (100 mg total) by mouth every 8 (eight) hours. 11/08/18   Law, Waylan Boga, PA-C  ibuprofen (ADVIL,MOTRIN) 800 MG tablet Take 1 tablet (800 mg total) by mouth every 8 (eight) hours as needed. 12/21/18   Cleota Pellerito, Cristal Deer, PA-C  sucralfate (CARAFATE) 1 g tablet Take 1 tablet (1 g total) by mouth 4 (four) times daily -  with meals and at bedtime. 12/21/18   Keyarah Mcroy, Cristal Deer, PA-C    Family History No family history on file.  Social History Social History   Tobacco Use  . Smoking status: Current Every  Day Smoker    Packs/day: 1.00    Types: Cigarettes  . Smokeless tobacco: Never Used  Substance Use Topics  . Alcohol use: Yes  . Drug use: No     Allergies   Penicillins and Zithromax [azithromycin]   Review of Systems Review of Systems All other systems negative except as documented in the HPI. All pertinent positives and negatives as reviewed in the HPI.  Physical Exam Updated Vital Signs BP 129/77   Pulse 80   Temp 98.6 F (37 C) (Oral)   Resp (!) 25   Ht 5\' 9"  (1.753 m)   Wt (!) 148 kg   LMP 12/16/2018   SpO2 99%   BMI 48.18 kg/m   Physical Exam Vitals signs and nursing note reviewed.  Constitutional:      General: She is not in acute distress.    Appearance: She is well-developed.  HENT:     Head: Normocephalic and atraumatic.  Eyes:     Pupils: Pupils are equal, round, and reactive to light.  Neck:     Musculoskeletal: Normal range of motion and neck supple.  Cardiovascular:     Rate and Rhythm: Normal rate and regular rhythm.     Heart sounds: Normal heart sounds. No murmur. No friction rub. No gallop.   Pulmonary:     Effort: Pulmonary effort is normal. No respiratory distress.     Breath sounds: Normal breath sounds. No wheezing.  Abdominal:     General: Bowel sounds are normal. There is no distension.     Palpations: Abdomen is soft.     Tenderness: There is no abdominal tenderness.  Musculoskeletal: Normal range of motion.  Skin:    General: Skin is warm and dry.     Capillary Refill: Capillary refill takes less than 2 seconds.     Findings: No erythema or rash.  Neurological:     Mental Status: She is alert and oriented to person, place, and time.     Motor: No abnormal muscle tone.     Coordination: Coordination normal.  Psychiatric:        Behavior: Behavior normal.      ED Treatments / Results  Labs (all labs ordered are listed, but only abnormal results are displayed) Labs Reviewed  CBC WITH DIFFERENTIAL/PLATELET - Abnormal;  Notable for the following components:      Result Value   MCH 25.5 (*)    MCHC 29.9 (*)    All other components within normal limits  BASIC METABOLIC PANEL - Abnormal; Notable for the following components:   Calcium 8.8 (*)    All other components within normal limits  TROPONIN I  PREGNANCY, URINE    EKG EKG Interpretation  Date/Time:  Wednesday December 21 2018 15:54:55 EST Ventricular Rate:  83 PR Interval:  160 QRS Duration: 88 QT Interval:  360 QTC Calculation: 423 R Axis:   56 Text Interpretation:  Normal sinus rhythm Normal ECG No significant change was found Confirmed by Azalia Bilis ([id number]) on 12/21/2018 4:48:37 PM   Radiology Dg Chest 2 View  Result Date: 12/21/2018 CLINICAL DATA:  Chest pain since last night. EXAM: CHEST - 2 VIEW COMPARISON:  06/08/2018 FINDINGS: Normal heart, mediastinum and hila. Clear lungs.  No pleural effusion or pneumothorax. Skeletal structures are within normal limits. IMPRESSION: Normal chest radiographs. Electronically Signed   By: Amie Portland M.D.   On: 12/21/2018 16:25    Procedures Procedures (including critical care time)  Medications Ordered in ED Medications - No data to display   Initial Impression / Assessment and Plan / ED Course  I have reviewed the triage vital signs and the nursing notes.  Pertinent labs & imaging results that were available during my care of the patient were reviewed by me and considered in my medical decision making (see chart for details).     Patient has atypical chest pain and this could represent a acid reflux type situation versus a pleuritic type scenario.  The pain is been constant and there is no alleviating factors or aggravating factors.  Advised the patient to return here as needed.  Told to follow-up with her primary doctor.   Final Clinical Impressions(s) / ED Diagnoses   Final diagnoses:  Atypical chest pain    ED Discharge Orders         Ordered    ibuprofen (ADVIL,MOTRIN) 800 MG  tablet  Every 8 hours PRN     12/21/18 1841    sucralfate (CARAFATE) 1 g tablet  3 times daily with meals & bedtime     12/21/18 1841           Charlestine Night, PA-C 12/22/18 Quincy Sheehan, MD 12/23/18 0045

## 2020-05-11 DIAGNOSIS — M2241 Chondromalacia patellae, right knee: Secondary | ICD-10-CM

## 2020-09-23 DIAGNOSIS — E7800 Pure hypercholesterolemia, unspecified: Secondary | ICD-10-CM | POA: Insufficient documentation

## 2021-10-31 ENCOUNTER — Encounter (HOSPITAL_BASED_OUTPATIENT_CLINIC_OR_DEPARTMENT_OTHER): Payer: Self-pay

## 2021-10-31 ENCOUNTER — Emergency Department (HOSPITAL_BASED_OUTPATIENT_CLINIC_OR_DEPARTMENT_OTHER)
Admission: EM | Admit: 2021-10-31 | Discharge: 2021-10-31 | Disposition: A | Discharge status: Home / Self Care | Payer: Medicaid Other | Attending: Emergency Medicine | Admitting: Emergency Medicine

## 2021-10-31 ENCOUNTER — Emergency Department (HOSPITAL_BASED_OUTPATIENT_CLINIC_OR_DEPARTMENT_OTHER): Payer: Medicaid Other

## 2021-10-31 ENCOUNTER — Other Ambulatory Visit: Payer: Self-pay

## 2021-10-31 ENCOUNTER — Telehealth: Payer: Self-pay | Admitting: Physician Assistant

## 2021-10-31 DIAGNOSIS — F17210 Nicotine dependence, cigarettes, uncomplicated: Secondary | ICD-10-CM | POA: Diagnosis not present

## 2021-10-31 DIAGNOSIS — I4891 Unspecified atrial fibrillation: Secondary | ICD-10-CM | POA: Diagnosis not present

## 2021-10-31 DIAGNOSIS — R0602 Shortness of breath: Secondary | ICD-10-CM | POA: Insufficient documentation

## 2021-10-31 DIAGNOSIS — Z20822 Contact with and (suspected) exposure to covid-19: Secondary | ICD-10-CM | POA: Insufficient documentation

## 2021-10-31 DIAGNOSIS — R059 Cough, unspecified: Secondary | ICD-10-CM | POA: Diagnosis present

## 2021-10-31 DIAGNOSIS — J069 Acute upper respiratory infection, unspecified: Secondary | ICD-10-CM | POA: Diagnosis not present

## 2021-10-31 DIAGNOSIS — Z79899 Other long term (current) drug therapy: Secondary | ICD-10-CM | POA: Diagnosis not present

## 2021-10-31 DIAGNOSIS — B9789 Other viral agents as the cause of diseases classified elsewhere: Secondary | ICD-10-CM

## 2021-10-31 DIAGNOSIS — Z7901 Long term (current) use of anticoagulants: Secondary | ICD-10-CM | POA: Insufficient documentation

## 2021-10-31 LAB — RESP PANEL BY RT-PCR (FLU A&B, COVID) ARPGX2
Influenza A by PCR: NEGATIVE
Influenza B by PCR: NEGATIVE
SARS Coronavirus 2 by RT PCR: NEGATIVE

## 2021-10-31 LAB — CBC WITH DIFFERENTIAL/PLATELET
Abs Immature Granulocytes: 0.05 10*3/uL (ref 0.00–0.07)
Basophils Absolute: 0 10*3/uL (ref 0.0–0.1)
Basophils Relative: 0 %
Eosinophils Absolute: 0.5 10*3/uL (ref 0.0–0.5)
Eosinophils Relative: 4 %
HCT: 46.3 % — ABNORMAL HIGH (ref 36.0–46.0)
Hemoglobin: 14.9 g/dL (ref 12.0–15.0)
Immature Granulocytes: 0 %
Lymphocytes Relative: 28 %
Lymphs Abs: 3.6 10*3/uL (ref 0.7–4.0)
MCH: 26.9 pg (ref 26.0–34.0)
MCHC: 32.2 g/dL (ref 30.0–36.0)
MCV: 83.6 fL (ref 80.0–100.0)
Monocytes Absolute: 0.8 10*3/uL (ref 0.1–1.0)
Monocytes Relative: 6 %
Neutro Abs: 7.9 10*3/uL — ABNORMAL HIGH (ref 1.7–7.7)
Neutrophils Relative %: 62 %
Platelets: 317 10*3/uL (ref 150–400)
RBC: 5.54 MIL/uL — ABNORMAL HIGH (ref 3.87–5.11)
RDW: 13.7 % (ref 11.5–15.5)
WBC: 12.9 10*3/uL — ABNORMAL HIGH (ref 4.0–10.5)
nRBC: 0 % (ref 0.0–0.2)

## 2021-10-31 LAB — COMPREHENSIVE METABOLIC PANEL
ALT: 14 U/L (ref 0–44)
AST: 13 U/L — ABNORMAL LOW (ref 15–41)
Albumin: 3.4 g/dL — ABNORMAL LOW (ref 3.5–5.0)
Alkaline Phosphatase: 56 U/L (ref 38–126)
Anion gap: 6 (ref 5–15)
BUN: 13 mg/dL (ref 6–20)
CO2: 25 mmol/L (ref 22–32)
Calcium: 8.6 mg/dL — ABNORMAL LOW (ref 8.9–10.3)
Chloride: 107 mmol/L (ref 98–111)
Creatinine, Ser: 0.58 mg/dL (ref 0.44–1.00)
GFR, Estimated: >60 mL/min (ref >60)
Glucose, Bld: 102 mg/dL — ABNORMAL HIGH (ref 70–99)
Potassium: 4 mmol/L (ref 3.5–5.1)
Sodium: 138 mmol/L (ref 135–145)
Total Bilirubin: 0.2 mg/dL — ABNORMAL LOW (ref 0.3–1.2)
Total Protein: 7.2 g/dL (ref 6.5–8.1)

## 2021-10-31 LAB — MAGNESIUM: Magnesium: 1.7 mg/dL (ref 1.7–2.4)

## 2021-10-31 LAB — D-DIMER, QUANTITATIVE: D-Dimer, Quant: 0.57 ug/mL-FEU — ABNORMAL HIGH (ref 0.00–0.50)

## 2021-10-31 LAB — TSH: TSH: 1.592 u[IU]/mL (ref 0.350–4.500)

## 2021-10-31 LAB — HCG, SERUM, QUALITATIVE: Preg, Serum: NEGATIVE

## 2021-10-31 MED ORDER — RIVAROXABAN 20 MG PO TABS: 20.0000 mg | ORAL_TABLET | Freq: Every day | ORAL | 0 refills | Status: DC

## 2021-10-31 MED ORDER — IOHEXOL 350 MG/ML SOLN
100.0000 mL | Freq: Once | INTRAVENOUS | Status: AC | PRN
  Administered 2021-10-31: 100 mL via INTRAVENOUS

## 2021-10-31 MED ORDER — DILTIAZEM HCL-DEXTROSE 125-5 MG/125ML-% IV SOLN (PREMIX)
5.0000 mg/h | INTRAVENOUS | Status: DC
  Administered 2021-10-31: 5 mg/h via INTRAVENOUS
  Filled 2021-10-31: qty 125

## 2021-10-31 MED ORDER — SODIUM CHLORIDE 0.9 % IV BOLUS
1000.0000 mL | Freq: Once | INTRAVENOUS | Status: AC
  Administered 2021-10-31: 1000 mL via INTRAVENOUS

## 2021-10-31 MED ORDER — HYDROCOD POLST-CPM POLST ER 10-8 MG/5ML PO SUER: 5.0000 mL | Freq: Once | ORAL | Status: DC

## 2021-10-31 MED ORDER — METOPROLOL TARTRATE 25 MG PO TABS: 25.0000 mg | ORAL_TABLET | Freq: Two times a day (BID) | ORAL | 0 refills | Status: DC

## 2021-10-31 MED ORDER — DILTIAZEM LOAD VIA INFUSION
20.0000 mg | Freq: Once | INTRAVENOUS | Status: AC
  Administered 2021-10-31: 20 mg via INTRAVENOUS
  Filled 2021-10-31: qty 20

## 2021-10-31 NOTE — ED Provider Notes (Signed)
[facility] EMERGENCY DEPARTMENT Provider Note   CSN: [id number] Arrival date & time: 10/31/21  [id number]     History Chief Complaint  Patient presents with   Cough    Terri Robbins is a 29 y.o. female.  Presented to the emergency room with concern for cough.  Patient reports that over the past 3 days or so she has been having intermittent cough, congestion.  Coughing up sometimes clear, sometimes greenish phlegm.  Denies difficulty in breathing.  Has had intermittent palpitations over the last couple days.  Does not currently have any palpitations.  Denies prior history of atrial fibrillation.  Does state that she has a history of hypertension but is not currently on any medication for this.  No fevers or chills.  Has had generalized malaise.    HPI     History reviewed. No pertinent past medical history.  There are no problems to display for this patient.   Past Surgical History:  Procedure Laterality Date   TONSILLECTOMY       OB History   No obstetric history on file.     History reviewed. No pertinent family history.  Social History   Tobacco Use   Smoking status: Every Day    Packs/day: 1.00    Types: Cigarettes   Smokeless tobacco: Never  Vaping Use   Vaping Use: Never used  Substance Use Topics   Alcohol use: Yes    Comment: social   Drug use: No    Home Medications Prior to Admission medications   Medication Sig Start Date End Date Taking? Authorizing Provider  metoprolol tartrate (LOPRESSOR) 25 MG tablet Take 1 tablet (25 mg total) by mouth 2 (two) times daily. 10/31/21  Yes Lucrezia Starch, MD  rivaroxaban (XARELTO) 20 MG TABS tablet Take 1 tablet (20 mg total) by mouth daily with supper. 10/31/21  Yes Lucrezia Starch, MD  acetaminophen (TYLENOL) 500 MG tablet Take 1 tablet (500 mg total) by mouth every 6 (six) hours as needed. 11/08/18   Law, Bea Graff, PA-C  benzonatate (TESSALON) 100 MG capsule Take 1 capsule (100 mg total) by  mouth every 8 (eight) hours. 11/08/18   Law, Bea Graff, PA-C  ibuprofen (ADVIL,MOTRIN) 800 MG tablet Take 1 tablet (800 mg total) by mouth every 8 (eight) hours as needed. 12/21/18   Lawyer, Harrell Gave, PA-C  sucralfate (CARAFATE) 1 g tablet Take 1 tablet (1 g total) by mouth 4 (four) times daily -  with meals and at bedtime. 12/21/18   Lawyer, Harrell Gave, PA-C    Allergies    Penicillins and Zithromax [azithromycin]  Review of Systems   Review of Systems  Constitutional:  Negative for chills and fever.  HENT:  Negative for ear pain and sore throat.   Eyes:  Negative for pain and visual disturbance.  Respiratory:  Positive for cough and shortness of breath.   Cardiovascular:  Positive for palpitations. Negative for chest pain.  Gastrointestinal:  Negative for abdominal pain and vomiting.  Genitourinary:  Negative for dysuria and hematuria.  Musculoskeletal:  Negative for arthralgias and back pain.  Skin:  Negative for color change and rash.  Neurological:  Negative for seizures and syncope.  All other systems reviewed and are negative.  Physical Exam Updated Vital Signs BP 118/69   Pulse (!) 102   Temp (!) 97.5 F (36.4 C)   Resp 17   Ht 5' 9.5" (1.765 m)   Wt (!) 147.4 kg   SpO2 100%   BMI  47.31 kg/m   Physical Exam Vitals and nursing note reviewed.  Constitutional:      General: She is not in acute distress.    Appearance: She is well-developed.  HENT:     Head: Normocephalic and atraumatic.  Eyes:     Conjunctiva/sclera: Conjunctivae normal.  Cardiovascular:     Rate and Rhythm: Tachycardia present. Rhythm irregular.     Pulses: Normal pulses.     Heart sounds: No murmur heard. Pulmonary:     Effort: Pulmonary effort is normal. No respiratory distress.     Breath sounds: Normal breath sounds.  Abdominal:     Palpations: Abdomen is soft.     Tenderness: There is no abdominal tenderness.  Musculoskeletal:        General: No swelling.     Cervical back: Neck  supple.  Skin:    General: Skin is warm and dry.     Capillary Refill: Capillary refill takes less than 2 seconds.  Neurological:     General: No focal deficit present.     Mental Status: She is alert.  Psychiatric:        Mood and Affect: Mood normal.    ED Results / Procedures / Treatments   Labs (all labs ordered are listed, but only abnormal results are displayed) Labs Reviewed  CBC WITH DIFFERENTIAL/PLATELET - Abnormal; Notable for the following components:      Result Value   WBC 12.9 (*)    RBC 5.54 (*)    HCT 46.3 (*)    Neutro Abs 7.9 (*)    All other components within normal limits  COMPREHENSIVE METABOLIC PANEL - Abnormal; Notable for the following components:   Glucose, Bld 102 (*)    Calcium 8.6 (*)    Albumin 3.4 (*)    AST 13 (*)    Total Bilirubin 0.2 (*)    All other components within normal limits  D-DIMER, QUANTITATIVE - Abnormal; Notable for the following components:   D-Dimer, Quant 0.57 (*)    All other components within normal limits  RESP PANEL BY RT-PCR (FLU A&B, COVID) ARPGX2  MAGNESIUM  HCG, SERUM, QUALITATIVE  TSH    EKG EKG Interpretation  Date/Time:  Friday October 31 2021 09:54:19 EST Ventricular Rate:  156 PR Interval:    QRS Duration: 87 QT Interval:  293 QTC Calculation: 472 R Axis:   58 Text Interpretation: Atrial fibrillation Borderline T abnormalities, inferior leads Confirmed by Madalyn Rob [phone]) on 10/31/2021 9:56:33 AM  Radiology CT Angio Chest Pulmonary Embolism (PE) W or WO Contrast  Result Date: 10/31/2021 CLINICAL DATA:  Chest pain/shortness of breath.  Productive cough. EXAM: CT ANGIOGRAPHY CHEST WITH CONTRAST TECHNIQUE: Multidetector CT imaging of the chest was performed using the standard protocol during bolus administration of intravenous contrast. Multiplanar CT image reconstructions and MIPs were obtained to evaluate the vascular anatomy. CONTRAST:  136mL OMNIPAQUE IOHEXOL 350 MG/ML SOLN COMPARISON:  Chest  radiograph 10/31/2021 FINDINGS: Body habitus reduces diagnostic sensitivity and specificity. Contrast bolus is late but adequate, with the potential benefits of reinjection and rescanning not seen as substantially outweighing the additional radiation and contrast exposure. Cardiovascular: No filling defect is identified in the pulmonary arterial tree to suggest pulmonary embolus. Mild cardiomegaly. Mediastinum/Nodes: Anterior mediastinal density likely represents thymic tissue. Small bilateral axillary lymph nodes are present. Lungs/Pleura: Unremarkable Upper Abdomen: Unremarkable Musculoskeletal: Mild lower thoracic spondylosis. Review of the MIP images confirms the above findings. IMPRESSION: 1. No filling defect is identified in the pulmonary arterial  tree to suggest pulmonary embolus. 2. Mild cardiomegaly. 3. Mild lower thoracic spondylosis. 4. Mildly reduced sensitivity primarily from body habitus. Electronically Signed   By: Gaylyn Rong M.D.   On: 10/31/2021 12:31   DG Chest Portable 1 View  Result Date: 10/31/2021 CLINICAL DATA:  Cough, chest pain EXAM: PORTABLE CHEST 1 VIEW COMPARISON:  None. FINDINGS: The heart size and mediastinal contours are within normal limits. Both lungs are clear. No pleural effusion. The visualized skeletal structures are unremarkable. IMPRESSION: No active disease. Electronically Signed   By: Guadlupe Spanish M.D.   On: 10/31/2021 10:00    Procedures Procedures   Medications Ordered in ED Medications  diltiazem (CARDIZEM) 125 mg in dextrose 5% 125 mL (1 mg/mL) infusion (0 mg/hr Intravenous Stopped 10/31/21 1330)  chlorpheniramine-HYDROcodone (TUSSIONEX) 10-8 MG/5ML suspension 5 mL (has no administration in time range)  sodium chloride 0.9 % bolus 1,000 mL (0 mLs Intravenous Stopped 10/31/21 1405)  diltiazem (CARDIZEM) 1 mg/mL load via infusion 20 mg (20 mg Intravenous Bolus from Bag 10/31/21 1044)  iohexol (OMNIPAQUE) 350 MG/ML injection 100 mL (100 mLs  Intravenous Contrast Given 10/31/21 1210)    ED Course  I have reviewed the triage vital signs and the nursing notes.  Pertinent labs & imaging results that were available during my care of the patient were reviewed by me and considered in my medical decision making (see chart for details).    MDM Rules/Calculators/A&P     CHA2DS2-VASc Score: 2                    30 year old lady presents to ER with concern for cough, congestion.  On exam she appeared well in no distress, EKG concerning for atrial fibrillation with RVR.  Heart rate on monitor initially 150s.  Basic labs were grossly stable.  No significant electrolyte derangement.  No anemia.  D-dimer mildly elevated, CTA chest negative for pulmonary embolism.  No pneumonia.  COVID and flu negative.  Without clear onset of A. fib, deferred ER cardioversion.  For rate control had initiated diltiazem drip with bolus.  On reassessment, recommended patient be admitted for further management.  She initially said that she was willing to be admitted however later I was informed that she was no longer willing to be admitted to the hospital.  I had lengthy discussion with patient about atrial fibrillation and importance of admission for rate control, careful titration of medication.  Discussed risks of discharge including death.  Patient demonstrated understanding and ultimately chose to leave AMA.  Prior to leaving AMA, I did reach out to cardiology to discuss the situation, Dr. Hart Rochester.  Reviewed case.  She will try to help arrange follow-up in the A. fib clinic.  She recommended starting DOAC xarelto, metoprolol 25 mg twice daily.  Reviewed these recommendations with patient.  No contraindications for anticoagulation at present.  Pt signed out AMA.  Final Clinical Impression(s) / ED Diagnoses Final diagnoses:  Atrial fibrillation, unspecified type (HCC)  Viral respiratory illness    Rx / DC Orders ED Discharge Orders          Ordered    Amb  Referral to AFIB Clinic        10/31/21 1335    metoprolol tartrate (LOPRESSOR) 25 MG tablet  2 times daily        10/31/21 1338    rivaroxaban (XARELTO) 20 MG TABS tablet  Daily with supper        10/31/21 1338  Lucrezia Starch, MD 10/31/21 [phone]

## 2021-10-31 NOTE — ED Notes (Signed)
EDP provider notified of HR and given EKG

## 2021-10-31 NOTE — ED Notes (Signed)
Portable CXR completed.

## 2021-10-31 NOTE — ED Triage Notes (Signed)
Pt started with a sore throat and headache a week ago. States has been having productive cough, cough worse at night. C/o chest tightness with cough.

## 2021-10-31 NOTE — ED Notes (Signed)
Pt discharged AMA. States understanding of risk involved leaving AMA. Pt able to ambulate inependent from room

## 2021-10-31 NOTE — Discharge Instructions (Signed)
Please follow-up with the cardiologist.  We strongly recommended admission but you have chosen to leave against our medical advice.  If you change your mind anytime or if your symptoms worsen in any way, please come back to the emergency room.  The cardiologist has recommended starting a medicine called metoprolol as well as a blood thinner called Xarelto.  This will help reduce risk of stroke with your A. fib.  If you have any bleeding events, blood in stool, any head trauma or other concerns, come back to ER for reassessment.

## 2021-10-31 NOTE — ED Notes (Signed)
Pt called out to speak to nurse. This RN entered room. Pt states "im not trying to get admitted, that's not why I came". This RN explained to patient that she is in an abnormal heart rhythm that could be dangerous if not monitored. Pt continues to argue that her HR is due to her cough and that she has pneumonia or bronchitis. Pt explained to that her chest xray was normal. Pt then states that she wasn't given anything for her cough. This RN informed her that they just ordered her medicine and that I can grab it for her, pt states "I just want to speak with the doctor, im trying to leave".   Provider notified

## 2021-10-31 NOTE — ED Notes (Signed)
Assisted pt on bedpan to void 

## 2021-10-31 NOTE — Telephone Encounter (Signed)
   Dr. Tenny Craw received a DOD call from EDP about this patient with new afib RVR and recommended follow-up in the atrial fib clinic as well as outpatient echocardiogram. Will route to Ventura Sellers with afib clinic to help set up follow-up. Will also send staff msg to office schedulers to arrange echo; order placed under Dr. Tenny Craw. Offices will call patient with these appts. Konnor Jorden PA-C

## 2021-10-31 NOTE — ED Notes (Signed)
Pt informs this nurse she does not want to be admitted to the hospital and wants to be discharged, This RN to notify EDP Stevie Kern MD

## 2021-11-03 NOTE — Telephone Encounter (Signed)
Patient returned my message and is agreeable to appt 11/12/21 at 2 pm with [facility]; directions to clinic provided to patient.

## 2021-11-03 NOTE — Telephone Encounter (Signed)
Called and left VM for patient to call back to schedule f/u appt.

## 2021-11-12 ENCOUNTER — Ambulatory Visit (HOSPITAL_COMMUNITY): Payer: Medicaid Other | Admitting: Physician Assistant

## 2021-11-24 ENCOUNTER — Other Ambulatory Visit (HOSPITAL_COMMUNITY): Payer: Medicaid Other

## 2021-11-28 ENCOUNTER — Other Ambulatory Visit (HOSPITAL_BASED_OUTPATIENT_CLINIC_OR_DEPARTMENT_OTHER): Payer: Self-pay

## 2021-11-28 ENCOUNTER — Ambulatory Visit (HOSPITAL_BASED_OUTPATIENT_CLINIC_OR_DEPARTMENT_OTHER)
Admission: RE | Admit: 2021-11-28 | Discharge: 2021-11-28 | Disposition: A | Discharge status: Home / Self Care | Payer: Medicaid Other | Source: Ambulatory Visit | Attending: Internal Medicine | Admitting: Internal Medicine

## 2021-11-28 ENCOUNTER — Other Ambulatory Visit: Payer: Self-pay

## 2021-11-28 DIAGNOSIS — I4891 Unspecified atrial fibrillation: Secondary | ICD-10-CM | POA: Diagnosis not present

## 2021-11-28 LAB — ECHOCARDIOGRAM COMPLETE
AR max vel: 3.47 cm2
AV Area VTI: 4.12 cm2
AV Area mean vel: 3.55 cm2
AV Mean grad: 4.5 mmHg
AV Peak grad: 9 mmHg
Ao pk vel: 1.5 m/s
Area-P 1/2: 4.74 cm2
Calc EF: 56.6 %
S' Lateral: 2.8 cm
Single Plane A2C EF: 59.1 %
Single Plane A4C EF: 53.5 %

## 2021-12-03 ENCOUNTER — Ambulatory Visit (HOSPITAL_COMMUNITY)
Admission: RE | Admit: 2021-12-03 | Discharge: 2021-12-03 | Disposition: A | Discharge status: Home / Self Care | Payer: Medicaid Other | Source: Ambulatory Visit | Attending: Physician Assistant | Admitting: Physician Assistant

## 2021-12-03 ENCOUNTER — Encounter (HOSPITAL_COMMUNITY): Payer: Self-pay | Admitting: Physician Assistant

## 2021-12-03 ENCOUNTER — Other Ambulatory Visit: Payer: Self-pay

## 2021-12-03 VITALS — BP 128/70 | HR 87 | Ht 69.5 in | Wt 359.6 lb

## 2021-12-03 DIAGNOSIS — Z6841 Body Mass Index (BMI) 40.0 and over, adult: Secondary | ICD-10-CM | POA: Insufficient documentation

## 2021-12-03 DIAGNOSIS — Z79899 Other long term (current) drug therapy: Secondary | ICD-10-CM | POA: Insufficient documentation

## 2021-12-03 DIAGNOSIS — E669 Obesity, unspecified: Secondary | ICD-10-CM | POA: Insufficient documentation

## 2021-12-03 DIAGNOSIS — I480 Paroxysmal atrial fibrillation: Secondary | ICD-10-CM

## 2021-12-03 DIAGNOSIS — R0683 Snoring: Secondary | ICD-10-CM | POA: Insufficient documentation

## 2021-12-03 DIAGNOSIS — Z7182 Exercise counseling: Secondary | ICD-10-CM | POA: Insufficient documentation

## 2021-12-03 DIAGNOSIS — R400 Somnolence: Secondary | ICD-10-CM | POA: Diagnosis not present

## 2021-12-03 HISTORY — DX: Paroxysmal atrial fibrillation: I48.0

## 2021-12-03 MED ORDER — METOPROLOL TARTRATE 25 MG PO TABS: 25.0000 mg | ORAL_TABLET | Freq: Two times a day (BID) | ORAL | 1 refills | Status: DC

## 2021-12-03 NOTE — Progress Notes (Signed)
Primary Care Physician: Patient, No Pcp Per (Inactive) Primary Cardiologist: none Primary Electrophysiologist: none Referring Physician: Dr Ross Ludwig is a 30 y.o. female with a history of atrial fibrillation who presents for consultation in the [facility].  The patient was initially diagnosed with atrial fibrillation 10/31/21 after presenting to the ED with symptoms of URI. She was incidentally found to be in rapid afib. In hindsight, she did have some palpitations for a couple days prior. She was recommended for hospital admission but she declined and left the ED AMA. She was started on metoprolol. Patient was prescribed Xarelto for a CHADS2VASC score of 1 but she never started this due to cost concerns. She has felt well since leaving the ED. She is in SR today. She does admit to significant snoring and states several of her family members have OSA.   Today, she denies symptoms of palpitations, chest pain, shortness of breath, orthopnea, PND, lower extremity edema, dizziness, presyncope, syncope, bleeding, or neurologic sequela. The patient is tolerating medications without difficulties and is otherwise without complaint today.    Atrial Fibrillation Risk Factors:  she does have symptoms or diagnosis of sleep apnea. she is agreeable to sleep study. she does not have a history of rheumatic fever. she does not have a history of alcohol use. The patient does have a history of early familial atrial fibrillation or other arrhythmias. Mother has afib.  she has a BMI of Body mass index is 52.34 kg/m.Marland Kitchen Filed Weights   12/03/21 1505  Weight: (!) 163.1 kg    No family history on file.   Atrial Fibrillation Management history:  Previous antiarrhythmic drugs: none Previous cardioversions: none Previous ablations: none CHADS2VASC score: 1 Anticoagulation history: none   No past medical history on file. Past Surgical History:  Procedure  Laterality Date   TONSILLECTOMY      Current Outpatient Medications  Medication Sig Dispense Refill   acetaminophen (TYLENOL) 500 MG tablet Take 1 tablet (500 mg total) by mouth every 6 (six) hours as needed. 30 tablet 0   metoprolol tartrate (LOPRESSOR) 25 MG tablet Take 1 tablet (25 mg total) by mouth 2 (two) times daily. 60 tablet 0   rivaroxaban (XARELTO) 20 MG TABS tablet Take 1 tablet (20 mg total) by mouth daily with supper. (Patient not taking: Reported on 12/03/2021) 30 tablet 0   No current facility-administered medications for this encounter.    Allergies  Allergen Reactions   Penicillins    Zithromax [Azithromycin]     Social History   Socioeconomic History   Marital status: Single    Spouse name: Not on file   Number of children: Not on file   Years of education: Not on file   Highest education level: Not on file  Occupational History   Not on file  Tobacco Use   Smoking status: Every Day    Types: Cigars   Smokeless tobacco: Never   Tobacco comments:    1.5 black and milds daily 12/03/21  Vaping Use   Vaping Use: Never used  Substance and Sexual Activity   Alcohol use: Yes    Alcohol/week: 3.0 standard drinks    Types: 3 Standard drinks or equivalent per week    Comment: 3 mixed drinks socially 12/03/21   Drug use: No   Sexual activity: Never    Birth control/protection: Implant  Other Topics Concern   Not on file  Social History Narrative   Not on file  Social Determinants of Health   Financial Resource Strain: Not on file  Food Insecurity: Not on file  Transportation Needs: Not on file  Physical Activity: Not on file  Stress: Not on file  Social Connections: Not on file  Intimate Partner Violence: Not on file     ROS- All systems are reviewed and negative except as per the HPI above.  Physical Exam: Vitals:   12/03/21 1505  BP: 128/70  Pulse: 87  Weight: (!) 163.1 kg  Height: 5' 9.5" (1.765 m)    GEN- The patient is a well  appearing obese female, alert and oriented x 3 today.   Head- normocephalic, atraumatic Eyes-  Sclera clear, conjunctiva pink Ears- hearing intact Oropharynx- clear Neck- supple  Lungs- Clear to ausculation bilaterally, normal work of breathing Heart- Regular rate and rhythm, no murmurs, rubs or gallops  GI- soft, NT, ND, + BS Extremities- no clubbing, cyanosis, or edema MS- no significant deformity or atrophy Skin- no rash or lesion Psych- euthymic mood, full affect Neuro- strength and sensation are intact  Wt Readings from Last 3 Encounters:  12/03/21 (!) 163.1 kg  10/31/21 (!) 147.4 kg  12/21/18 (!) 148 kg    EKG today demonstrates  SR Vent. rate 87 BPM PR interval 156 ms QRS duration 88 ms QT/QTcB 356/428 ms  Echo 11/28/21 demonstrated   1. Left ventricular ejection fraction, by estimation, is 55 to 60%. The  left ventricle has normal function. The left ventricle has no regional  wall motion abnormalities. Left ventricular diastolic parameters were  normal.   2. Right ventricular systolic function is normal. The right ventricular  size is normal.   3. The mitral valve is normal in structure. No evidence of mitral valve  regurgitation. No evidence of mitral stenosis.   4. The aortic valve is normal in structure. Aortic valve regurgitation is  not visualized. No aortic stenosis is present.   5. The inferior vena cava is normal in size with greater than 50%  respiratory variability, suggesting right atrial pressure of 3 mmHg.   Epic records are reviewed at length today  CHA2DS2-VASc Score = 1  The patient's score is based upon: CHF History: 0 HTN History: 0 Diabetes History: 0 Stroke History: 0 Vascular Disease History: 0 Age Score: 0 Gender Score: 1       ASSESSMENT AND PLAN: 1. Paroxysmal Atrial Fibrillation (ICD10:  I48.0) The patient's CHA2DS2-VASc score is 1, indicating a 0.6% annual risk of stroke.   General education about afib provided and questions  answered. We also discussed her stroke risk and the risks and benefits of anticoagulation. No anticoagulation indicated at this time with low CV score. Continue Lopressor 25 mg BID Will refer for sleep study.  2. Obesity Body mass index is 52.34 kg/m. Lifestyle modification was discussed at length including regular exercise and weight reduction.  3. Snoring/daytime somnolence The importance of adequate treatment of sleep apnea was discussed today in order to improve our ability to maintain sinus rhythm long term. Will refer for sleep study.     Patient would like to establish care with cardiology closer to her home in [place] [facility]. Will refer to cardiology to establish care there.    [facility] [address] [place], [place] [address] [phone] 12/03/2021 3:28 PM

## 2021-12-16 ENCOUNTER — Telehealth: Payer: Self-pay | Admitting: *Deleted

## 2021-12-16 ENCOUNTER — Other Ambulatory Visit: Payer: Self-pay | Admitting: Physician Assistant

## 2021-12-16 DIAGNOSIS — I480 Paroxysmal atrial fibrillation: Secondary | ICD-10-CM

## 2021-12-16 DIAGNOSIS — R0683 Snoring: Secondary | ICD-10-CM

## 2021-12-16 NOTE — Telephone Encounter (Signed)
Patient notified of HST appointment. Per Clint Fenton ok for HST. Insurance denied in lab study.

## 2021-12-16 NOTE — Telephone Encounter (Signed)
-----   Message from Shona Simpson, RN sent at 12/03/2021  3:48 PM EST ----- Regarding: sleep study Pt needs sleep study for afib, snoring and daytime somnolence per Clint Fenton Thanks [employer]

## 2021-12-31 ENCOUNTER — Ambulatory Visit: Payer: Medicaid Other | Admitting: Cardiology

## 2021-12-31 NOTE — Progress Notes (Deleted)
Cardiology Office Note:    Date:  12/31/2021   ID:  Terri Robbins, DOB Apr 25, 1991, MRN [id number]  PCP:  Patient, No Pcp Per (Inactive)  Cardiologist:  Shirlee More, MD   Referring MD: Malka So R, PA  ASSESSMENT:    No diagnosis found. PLAN:    In order of problems listed above:  ***  Next appointment   Medication Adjustments/Labs and Tests Ordered: Current medicines are reviewed at length with the patient today.  Concerns regarding medicines are outlined above.  No orders of the defined types were placed in this encounter.  No orders of the defined types were placed in this encounter.    No chief complaint on file. ***  History of Present Illness:    Terri Robbins is a 31 y.o. female who is being seen today for the evaluation of *** at the request of Fenton, Clint R, PA. She was seen at [facility] 12/31/2020 found to be in atrial fibrillation with a rapid ventricular response.  She subsequently was seen in the atrial fibrillation clinic 12/03/2021 and was in sinus rhythm.  Her stroke risk was quite low she was given a prescription for anticoagulant but she never initiated.  She had an echocardiogram 11/28/2021 showing a structurally normal heart.  She was not instituted on anticoagulant was continued on low-dose beta-blocker and advised lifestyle modification for obesity with a BMI exceeding 53 and concerns of sleep apnea and referred for sleep study.  No past medical history on file.  Past Surgical History:  Procedure Laterality Date   TONSILLECTOMY      Current Medications: No outpatient medications have been marked as taking for the 12/31/21 encounter (Appointment) with Richardo Priest, MD.     Allergies:   Penicillins and Zithromax [azithromycin]   Social History   Socioeconomic History   Marital status: Single    Spouse name: Not on file   Number of children: Not on file   Years of education: Not on file   Highest education level: Not  on file  Occupational History   Not on file  Tobacco Use   Smoking status: Every Day    Types: Cigars   Smokeless tobacco: Never   Tobacco comments:    1.5 black and milds daily 12/03/21  Vaping Use   Vaping Use: Never used  Substance and Sexual Activity   Alcohol use: Yes    Alcohol/week: 3.0 standard drinks    Types: 3 Standard drinks or equivalent per week    Comment: 3 mixed drinks socially 12/03/21   Drug use: No   Sexual activity: Never    Birth control/protection: Implant  Other Topics Concern   Not on file  Social History Narrative   Not on file   Social Determinants of Health   Financial Resource Strain: Not on file  Food Insecurity: Not on file  Transportation Needs: Not on file  Physical Activity: Not on file  Stress: Not on file  Social Connections: Not on file     Family History: The patient's ***family history is not on file.  ROS:   ROS Please see the history of present illness.    *** All other systems reviewed and are negative.  EKGs/Labs/Other Studies Reviewed:    The following studies were reviewed today: Echo 11/28/21 demonstrated   1. Left ventricular ejection fraction, by estimation, is 55 to 60%. The  left ventricle has normal function. The left ventricle has no regional  wall motion abnormalities. Left ventricular  diastolic parameters were  normal.   2. Right ventricular systolic function is normal. The right ventricular  size is normal.   3. The mitral valve is normal in structure. No evidence of mitral valve  regurgitation. No evidence of mitral stenosis.   4. The aortic valve is normal in structure. Aortic valve regurgitation is  not visualized. No aortic stenosis is present.   5. The inferior vena cava is normal in size with greater than 50%  respiratory variability, suggesting right atrial pressure of 3 mmHg.     EKG:  EKG is *** ordered today.  The ekg ordered today is personally reviewed and demonstrates ***  Recent  Labs: 10/31/2021: ALT 14; BUN 13; Creatinine, Ser 0.58; Hemoglobin 14.9; Magnesium 1.7; Platelets 317; Potassium 4.0; Sodium 138; TSH 1.592  Recent Lipid Panel No results found for: CHOL, TRIG, HDL, CHOLHDL, VLDL, LDLCALC, LDLDIRECT  Physical Exam:    VS:  There were no vitals taken for this visit.    Wt Readings from Last 3 Encounters:  12/03/21 (!) 359 lb 9.6 oz (163.1 kg)  10/31/21 (!) 325 lb (147.4 kg)  12/21/18 (!) 326 lb 4.5 oz (148 kg)     GEN: *** Well nourished, well developed in no acute distress HEENT: Normal NECK: No JVD; No carotid bruits LYMPHATICS: No lymphadenopathy CARDIAC: ***RRR, no murmurs, rubs, gallops RESPIRATORY:  Clear to auscultation without rales, wheezing or rhonchi  ABDOMEN: Soft, non-tender, non-distended MUSCULOSKELETAL:  No edema; No deformity  SKIN: Warm and dry NEUROLOGIC:  Alert and oriented x 3 PSYCHIATRIC:  Normal affect     Signed, Shirlee More, MD  12/31/2021 12:35 PM    [facility]

## 2022-01-07 ENCOUNTER — Ambulatory Visit (HOSPITAL_BASED_OUTPATIENT_CLINIC_OR_DEPARTMENT_OTHER): Payer: Medicaid Other

## 2022-01-07 DIAGNOSIS — R0683 Snoring: Secondary | ICD-10-CM

## 2022-01-07 DIAGNOSIS — I480 Paroxysmal atrial fibrillation: Secondary | ICD-10-CM

## 2022-01-19 ENCOUNTER — Ambulatory Visit (HOSPITAL_BASED_OUTPATIENT_CLINIC_OR_DEPARTMENT_OTHER): Payer: Medicaid Other | Attending: Physician Assistant | Admitting: Cardiovascular Disease

## 2022-01-19 DIAGNOSIS — G4733 Obstructive sleep apnea (adult) (pediatric): Secondary | ICD-10-CM | POA: Diagnosis not present

## 2022-01-19 DIAGNOSIS — R0683 Snoring: Secondary | ICD-10-CM

## 2022-01-19 DIAGNOSIS — I480 Paroxysmal atrial fibrillation: Secondary | ICD-10-CM

## 2022-01-28 ENCOUNTER — Encounter: Payer: Self-pay | Admitting: Cardiology

## 2022-01-28 ENCOUNTER — Ambulatory Visit (INDEPENDENT_AMBULATORY_CARE_PROVIDER_SITE_OTHER): Payer: Medicaid Other | Admitting: Cardiology

## 2022-01-28 ENCOUNTER — Other Ambulatory Visit: Payer: Self-pay

## 2022-01-28 VITALS — BP 134/78 | HR 88 | Ht 69.5 in | Wt 361.0 lb

## 2022-01-28 DIAGNOSIS — I480 Paroxysmal atrial fibrillation: Secondary | ICD-10-CM

## 2022-01-28 DIAGNOSIS — R0609 Other forms of dyspnea: Secondary | ICD-10-CM

## 2022-01-28 DIAGNOSIS — G4733 Obstructive sleep apnea (adult) (pediatric): Secondary | ICD-10-CM

## 2022-01-28 HISTORY — DX: Other forms of dyspnea: R06.09

## 2022-01-28 HISTORY — DX: Obstructive sleep apnea (adult) (pediatric): G47.33

## 2022-01-28 HISTORY — DX: Morbid (severe) obesity due to excess calories: E66.01

## 2022-01-28 NOTE — Progress Notes (Signed)
Cardiology Consultation:    Date:  01/28/2022   ID:  Terri Robbins, DOB 17-Nov-1991, MRN [id number]  PCP:  Spero Geralds, MD  Cardiologist:  Gypsy Balsam, MD   Referring MD: Danice Goltz, [place]   No chief complaint on file. Paroxysmal atrial fibrillation  History of Present Illness:    Terri Robbins is a 31 y.o. female who is being seen today for the evaluation of atrial fibrillation at the request of Fenton, Clint R, PA.  She is a morbidly obese woman with no significant past medical history.  Couple weeks ago she ended up feeling very poorly she got cold-like symptoms, ended up going to the emergency room for evaluation she was found to be in atrial fibrillation with fast ventricular rate.  She was given beta-blocker and she converted to sinus rhythm.  Since her CHADS2 Vascor equals 0 she is not anticoagulated.  She also had first sleep study done however there is some technical difficulties and she is waiting for second sleep study.  She is doing well.  She denies have any chest pain, tightness, pressure, burning in her chest.  She is trying to be active but have difficulty doing.  Because of obesity.  History reviewed. No pertinent past medical history.  Past Surgical History:  Procedure Laterality Date   TONSILLECTOMY      Current Medications: Current Meds  Medication Sig   acetaminophen (TYLENOL) 500 MG tablet Take 1 tablet (500 mg total) by mouth every 6 (six) hours as needed. (Patient taking differently: Take 500 mg by mouth every 6 (six) hours as needed for moderate pain or mild pain.)   ergocalciferol (VITAMIN D2) 1.25 MG (50000 UT) capsule Take 1 capsule by mouth every 3 (three) days.   LINZESS 290 MCG CAPS capsule Take 290 mcg by mouth daily.   metoprolol tartrate (LOPRESSOR) 25 MG tablet Take 1 tablet (25 mg total) by mouth 2 (two) times daily.     Allergies:   Penicillins and Zithromax [azithromycin]   Social History   Socioeconomic History    Marital status: Single    Spouse name: Not on file   Number of children: Not on file   Years of education: Not on file   Highest education level: Not on file  Occupational History   Not on file  Tobacco Use   Smoking status: Every Day    Types: Cigars   Smokeless tobacco: Never   Tobacco comments:    1.5 black and milds daily 12/03/21  Vaping Use   Vaping Use: Never used  Substance and Sexual Activity   Alcohol use: Yes    Alcohol/week: 3.0 standard drinks    Types: 3 Standard drinks or equivalent per week    Comment: 3 mixed drinks socially 12/03/21   Drug use: No   Sexual activity: Never    Birth control/protection: Implant  Other Topics Concern   Not on file  Social History Narrative   Not on file   Social Determinants of Health   Financial Resource Strain: Not on file  Food Insecurity: Not on file  Transportation Needs: Not on file  Physical Activity: Not on file  Stress: Not on file  Social Connections: Not on file     Family History: The patient's family history includes Arrhythmia in her mother; COPD in her mother; Heart Problems in her mother. ROS:   Please see the history of present illness.    All 14 point review of systems negative except as described  per history of present illness.  EKGs/Labs/Other Studies Reviewed:    The following studies were reviewed today: I did review record for this visit  EKG:  EKG is  ordered today.  The ekg ordered today demonstrates normal sinus rhythm, normal P interval, normal QS complex duration fulgent no ST segment changes  Recent Labs: 10/31/2021: ALT 14; BUN 13; Creatinine, Ser 0.58; Hemoglobin 14.9; Magnesium 1.7; Platelets 317; Potassium 4.0; Sodium 138; TSH 1.592  Recent Lipid Panel No results found for: CHOL, TRIG, HDL, CHOLHDL, VLDL, LDLCALC, LDLDIRECT  Physical Exam:    VS:  BP 134/78 (BP Location: Right Arm)    Pulse 88    Ht 5' 9.5" (1.765 m)    Wt (!) 361 lb (163.7 kg)    SpO2 96%    BMI 52.55 kg/m      Wt Readings from Last 3 Encounters:  01/28/22 (!) 361 lb (163.7 kg)  01/20/22 (!) 359 lb (162.8 kg)  01/07/22 (!) 359 lb (162.8 kg)     GEN:  Well nourished, well developed in no acute distress HEENT: Normal NECK: No JVD; No carotid bruits LYMPHATICS: No lymphadenopathy CARDIAC: RRR, no murmurs, no rubs, no gallops RESPIRATORY:  Clear to auscultation without rales, wheezing or rhonchi  ABDOMEN: Soft, non-tender, non-distended MUSCULOSKELETAL:  No edema; No deformity  SKIN: Warm and dry NEUROLOGIC:  Alert and oriented x 3 PSYCHIATRIC:  Normal affect   ASSESSMENT:    1. Paroxysmal atrial fibrillation (HCC)   2. Dyspnea on exertion   3. Obstructive sleep apnea   4. Morbid obesity (HCC)    PLAN:    In order of problems listed above:  Paroxysmal atrial fibrillation only 1 documented episode when she was sick.  Her CHADS2 Vascor equals 0.  Gender should not be calculated as 1 if  there is no additional risk factor.  Overall her risk for having CVA is very low therefore there is no anticoagulation required.  It is a potential etiology of this phenomenon obviously sleep apnea should be considered and is already pending. Dyspnea on exertion.  Echocardiogram performed reviewed showing preserved left ventricle ejection fraction multifactorial most likely obesity play significant role here. Obstructive sleep apnea test test is pending we will wait for results hopefully by treating sleep apnea she will feel better should be able to lose some weight which will improve also her outcome in terms of atrial fibrillation Morbid obesity, I did discuss need to exercise with some weight I hope treatment sleep apnea will make it more visible.   Medication Adjustments/Labs and Tests Ordered: Current medicines are reviewed at length with the patient today.  Concerns regarding medicines are outlined above.  No orders of the defined types were placed in this encounter.  No orders of the defined types  were placed in this encounter.   Signed, Georgeanna Lea, MD, [facility]. 01/28/2022 4:16 PM    [facility]

## 2022-01-28 NOTE — Addendum Note (Signed)
Addended by: Viviano Simas on: 01/28/2022 04:39 PM   Modules accepted: Orders

## 2022-01-28 NOTE — Patient Instructions (Signed)
Medication Instructions:  °Your physician recommends that you continue on your current medications as directed. Please refer to the Current Medication list given to you today. ° °*If you need a refill on your cardiac medications before your next appointment, please call your pharmacy* ° ° °Lab Work: °None °If you have labs (blood work) drawn today and your tests are completely normal, you will receive your results only by: °MyChart Message (if you have MyChart) OR °A paper copy in the mail °If you have any lab test that is abnormal or we need to change your treatment, we will call you to review the results. ° ° °Testing/Procedures: °None ° ° °Follow-Up: °At [facility], you and your health needs are our priority.  As part of our continuing mission to provide you with exceptional heart care, we have created designated Provider Care Teams.  These Care Teams include your primary Cardiologist (physician) and Advanced Practice Providers (APPs -  Physician Assistants and Nurse Practitioners) who all work together to provide you with the care you need, when you need it. ° °We recommend signing up for the patient portal called "MyChart".  Sign up information is provided on this After Visit Summary.  MyChart is used to connect with patients for Virtual Visits (Telemedicine).  Patients are able to view lab/test results, encounter notes, upcoming appointments, etc.  Non-urgent messages can be sent to your provider as well.   °To learn more about what you can do with MyChart, go to [email or web address].   ° °Your next appointment:   °3 month(s) ° °The format for your next appointment:   °In Person ° °Provider:   °Robert Krasowski, MD  ° ° °Other Instructions °None ° °

## 2022-02-06 ENCOUNTER — Encounter (HOSPITAL_BASED_OUTPATIENT_CLINIC_OR_DEPARTMENT_OTHER): Payer: Self-pay | Admitting: Cardiovascular Disease

## 2022-02-06 NOTE — Procedures (Signed)
° ° ° ° ° ° °  Patient Name: Terri Robbins, Terri Robbins Study Date: 01/19/2022 Gender: Female D.O.B: 10/01/1991 Age (years): 30 Referring Provider: Alphonzo Severance PA Height (inches): 69 Interpreting Physician: Nicki Guadalajara MD, ABSM Weight (lbs): 359 RPSGT: [place] Sink BMI: 53 MRN: [id number] Neck Size: 15.00  CLINICAL INFORMATION Sleep Study Type: HST  Indication for sleep study: Snoring, daytime somnolence, AF, morbid obesity  Epworth Sleepiness Score:  6  SLEEP STUDY TECHNIQUE A multi-channel overnight portable sleep study was performed. The channels recorded were: nasal airflow, thoracic respiratory movement, and oxygen saturation with a pulse oximetry. Snoring was also monitored.  MEDICATIONS acetaminophen (TYLENOL) 500 MG tablet ergocalciferol (VITAMIN D2) 1.25 MG (50000 UT) capsule LINZESS 290 MCG CAPS capsule metoprolol tartrate (LOPRESSOR) 25 MG tablet  Patient self administered medications include: N/A.  SLEEP ARCHITECTURE Patient was studied for 383 minutes. The sleep efficiency was 100.0 % and the patient was supine for 10.7%. The arousal index was 0.0 per hour.  RESPIRATORY PARAMETERS The overall AHI was 14.1 per hour, with a central apnea index of 0 per hour.  The oxygen nadir was 90% during sleep.  CARDIAC DATA Mean heart rate during sleep was 81.1 bpm.  IMPRESSIONS - Mild obstructive sleep apnea occurred during this study (AHI 14.1/h). - The patient had no oxygen desaturation during the study (Min O2 90%) - Patient snored for 191.1 minutes (49.9%)  during the sleep.  DIAGNOSIS - Obstructive Sleep Apnea (G47.33)  RECOMMENDATIONS - In this patient with significant cardiovascular co-morbidities recommend therapeutic CPAP titration to determine optimal pressure required to alleviate sleep disordered breathing. If unbale for an in-lab titration initiate Auto - PAP with EPR of 3 at 6 - 18 cm of water. - Effort should be made to optimize nasal and  oropharyngeal patency. - Avoid alcohol, sedatives and other CNS depressants that may worsen sleep apnea and disrupt normal sleep architecture. - Sleep hygiene should be reviewed to assess factors that may improve sleep quality. - Weight management (BMI 53) and regular exercise should be initiated or continued. - Recommend a download and sleep clinic evaluation after one month of therapy.   [Electronically signed] 02/06/2022 05:25 PM  Nicki Guadalajara MD, [facility], ABSM Diplomate, American Board of Sleep Medicine   NPI: [id number]  [facility] PH: [phone]   FX: [phone] ACCREDITED BY THE AMERICAN ACADEMY OF SLEEP MEDICINE

## 2022-02-13 ENCOUNTER — Telehealth: Payer: Self-pay | Admitting: *Deleted

## 2022-02-13 NOTE — Telephone Encounter (Signed)
Left message to return a call to discuss HST results and recommendations. 

## 2022-02-13 NOTE — Telephone Encounter (Signed)
-----   Message from Lennette Bihari, MD sent at 02/06/2022  5:30 PM EST ----- ?Burna Mortimer, please notify pt and schedule for CPAP titration, if unable then Auto-PAP ?

## 2022-02-13 NOTE — Telephone Encounter (Signed)
Patient returned a call and was notified of HST results and recommendations. She agrees to start CPAP therapy. Order sent to [facility]. ?

## 2022-04-16 IMAGING — CT CT ANGIO CHEST
2 of 8 series · 19 of 36 positions shown · IV contrast (Omnipaque)
Comparison: Chest radiograph 10/31/2021

CLINICAL DATA: Chest pain/shortness of breath.  Productive cough.

EXAM:
CT ANGIOGRAPHY CHEST WITH CONTRAST
TECHNIQUE: Multidetector CT imaging of the chest was performed using the
standard protocol during bolus administration of intravenous
contrast. Multiplanar CT image reconstructions and MIPs were
obtained to evaluate the vascular anatomy.
CONTRAST:  100mL OMNIPAQUE IOHEXOL 350 MG/ML SOLN

[Series 6: pe coronal mpr · coronal · 0.55mm/px · 1 of 96 slices shown]
[im 48/96  mediastinal]
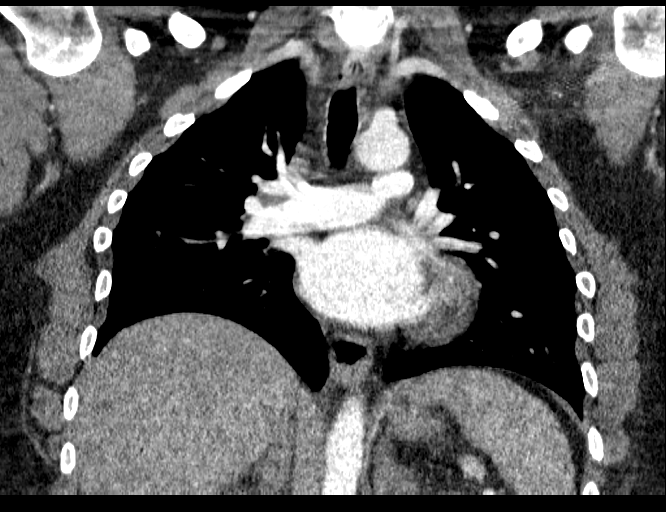

[Series 10: pe thins · axial · 0.76mm/px · z∈[+708,+951]mm · 18 of 360 slices shown]
[im 18/360  lung]
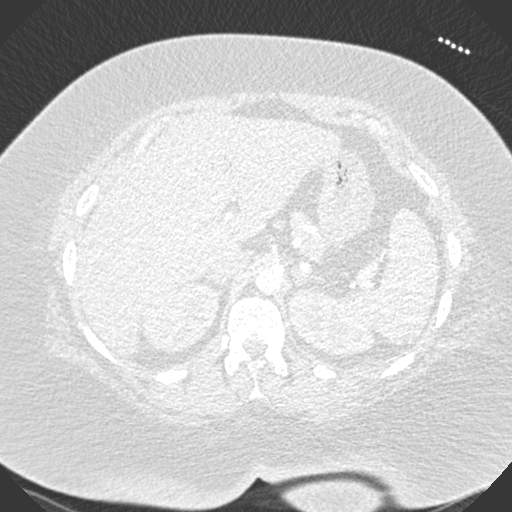
[im 36/360  mediastinal]
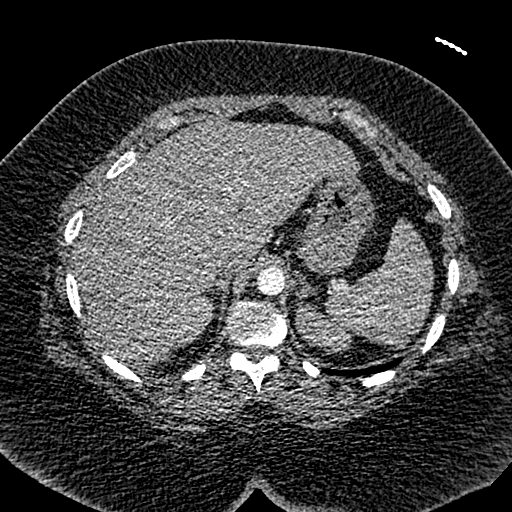
[im 54/360  lung]
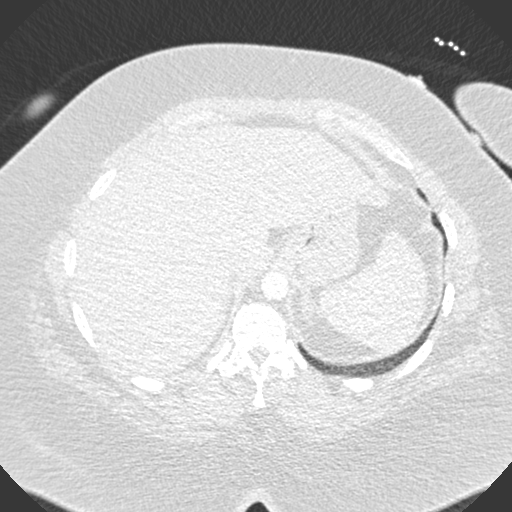
[im 72/360  mediastinal]
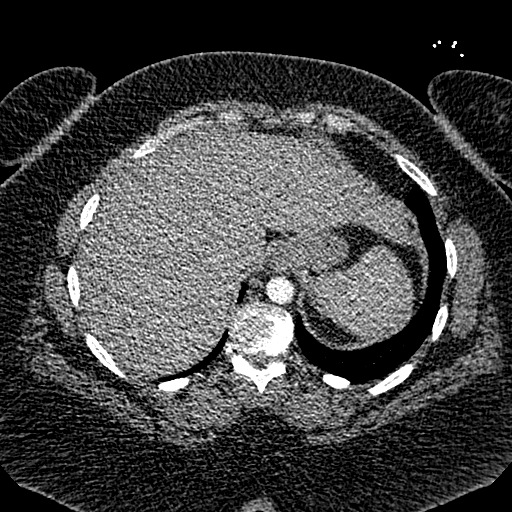
[im 90/360  lung]
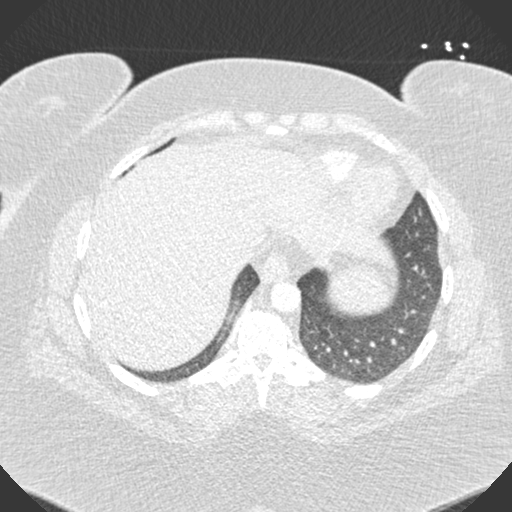
[im 108/360  mediastinal]
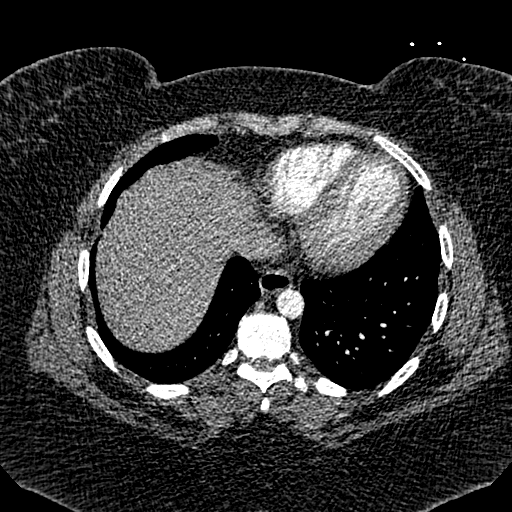
[im 126/360  lung]
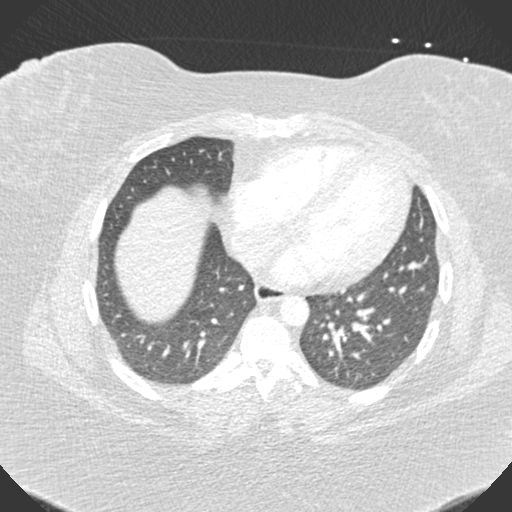
[im 144/360  mediastinal]
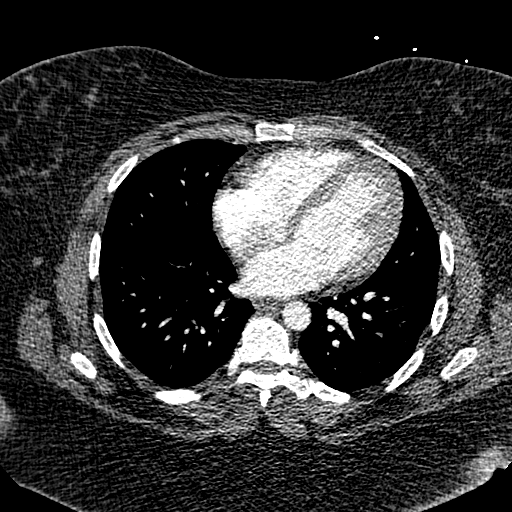
[im 162/360  lung]
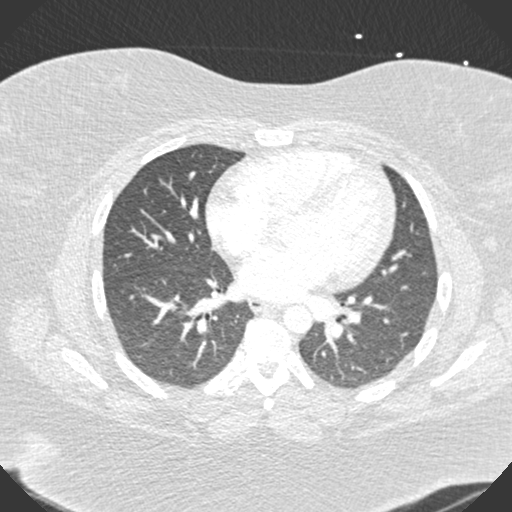
[im 198/360  mediastinal]
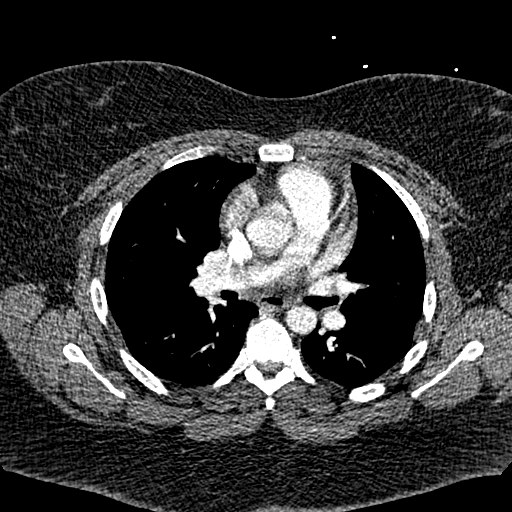
[im 216/360  lung]
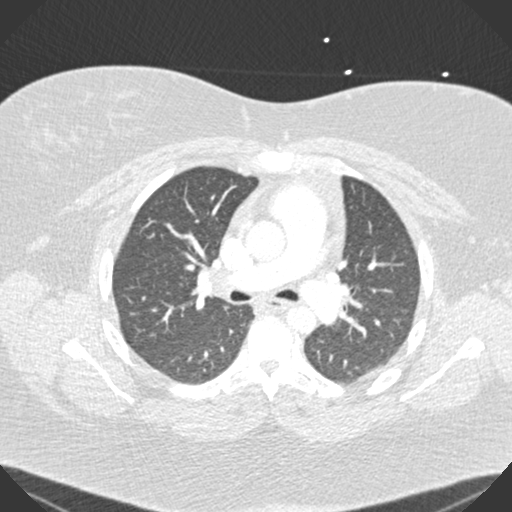
[im 234/360  mediastinal]
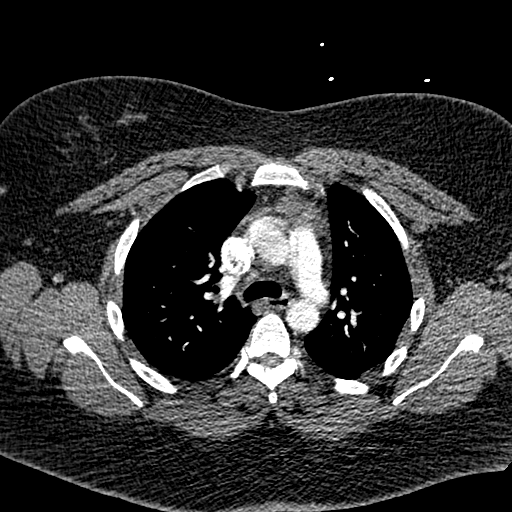
[im 252/360  lung]
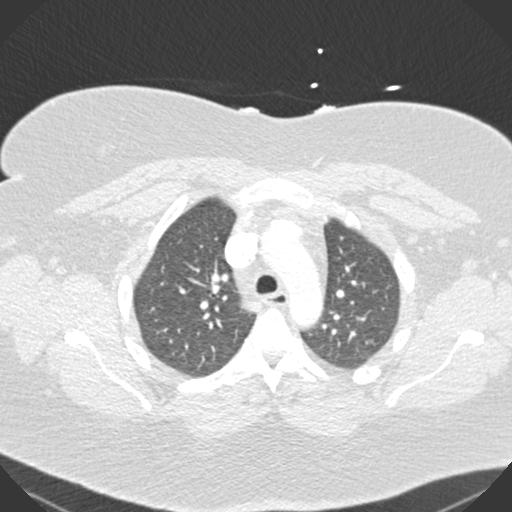
[im 270/360  mediastinal]
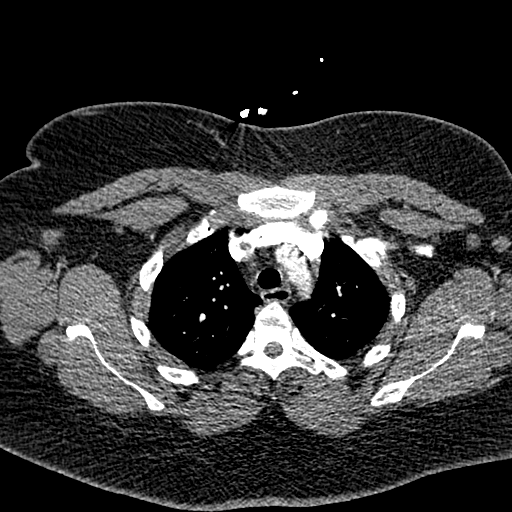
[im 288/360  lung]
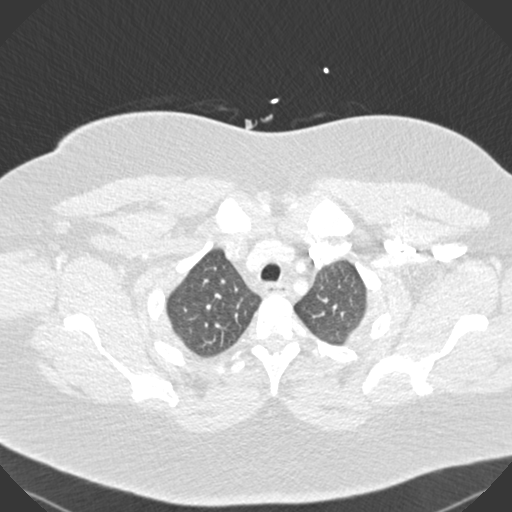
[im 306/360  mediastinal]
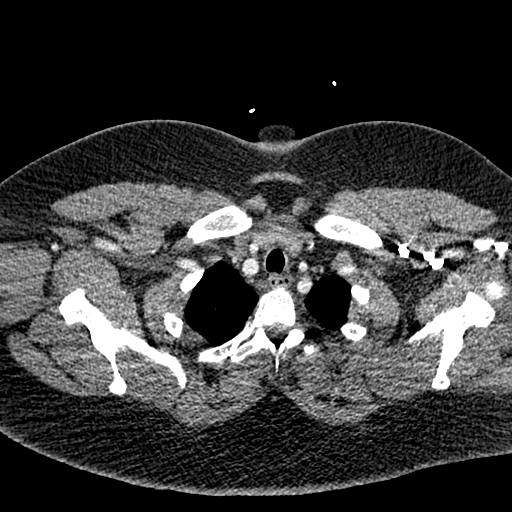
[im 324/360  lung]
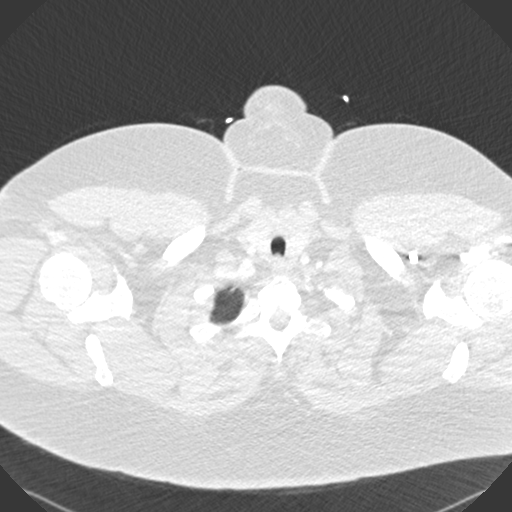
[im 342/360  mediastinal]
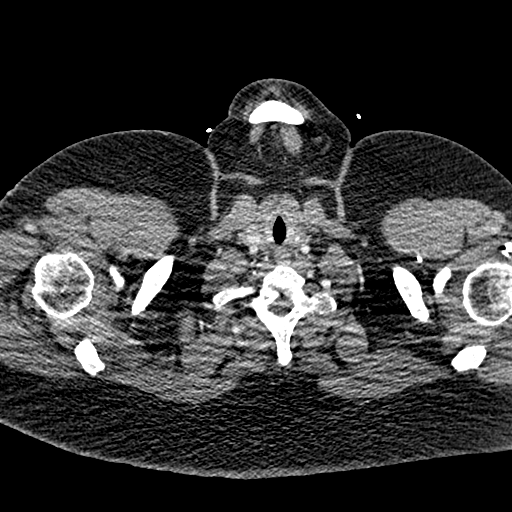

[19 of 36 positions shown; findings below may reference images not displayed]

FINDINGS: Body habitus reduces diagnostic sensitivity and specificity.
Contrast bolus is late but adequate, with the potential benefits of
reinjection and rescanning not seen as substantially outweighing the
additional radiation and contrast exposure.

Cardiovascular: No filling defect is identified in the pulmonary
arterial tree to suggest pulmonary embolus. Mild cardiomegaly.

Mediastinum/Nodes: Anterior mediastinal density likely represents
thymic tissue. Small bilateral axillary lymph nodes are present.

Lungs/Pleura: Unremarkable

Upper Abdomen: Unremarkable

Musculoskeletal: Mild lower thoracic spondylosis.

Review of the MIP images confirms the above findings.
IMPRESSION: 1. No filling defect is identified in the pulmonary arterial tree to
suggest pulmonary embolus.
2. Mild cardiomegaly.
3. Mild lower thoracic spondylosis.
4. Mildly reduced sensitivity primarily from body habitus.

## 2022-04-16 IMAGING — DX DG CHEST 1V PORT
1 series · 1 of 1 positions shown · non-contrast
Comparison: None.

CLINICAL DATA: Cough, chest pain

EXAM:
PORTABLE CHEST 1 VIEW

[chest ap]
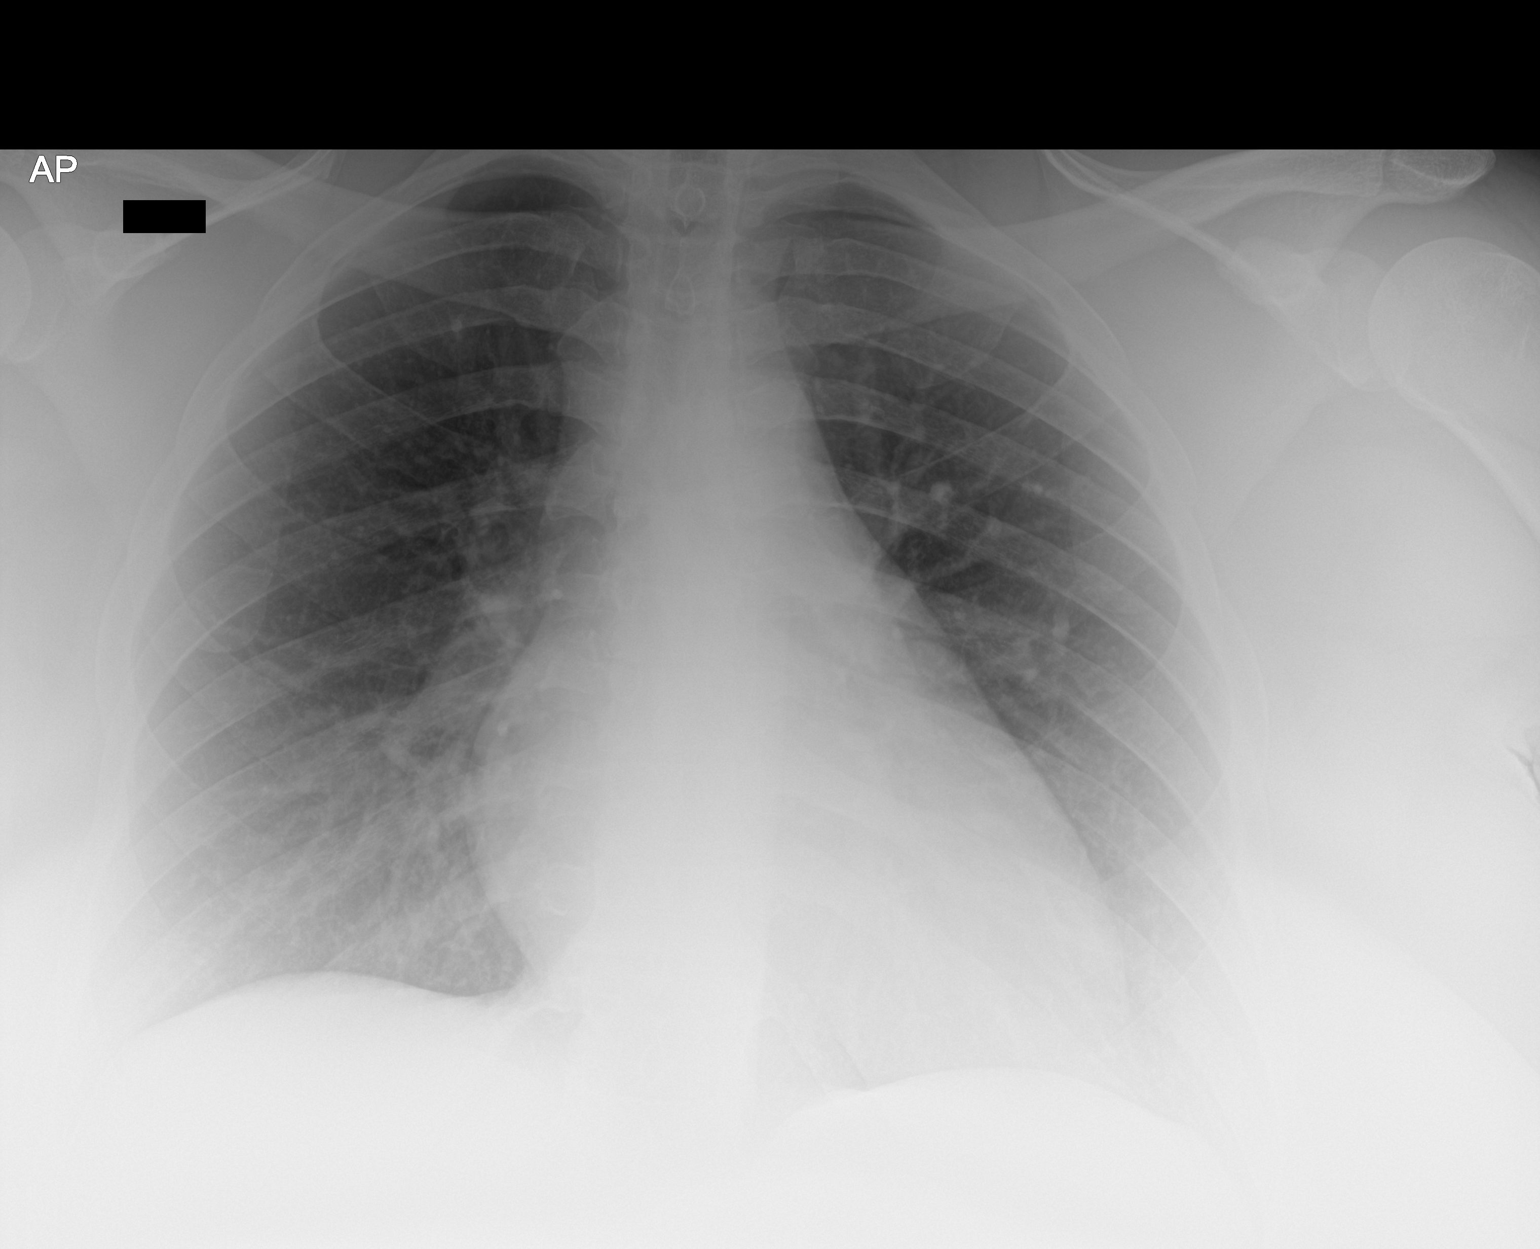

[1 of 1 positions shown; findings below may reference images not displayed]

FINDINGS: The heart size and mediastinal contours are within normal limits.
Both lungs are clear. No pleural effusion. The visualized skeletal
structures are unremarkable.
IMPRESSION: No active disease.

## 2022-05-11 ENCOUNTER — Encounter: Payer: Self-pay | Admitting: Cardiovascular Disease

## 2022-05-11 ENCOUNTER — Ambulatory Visit (INDEPENDENT_AMBULATORY_CARE_PROVIDER_SITE_OTHER): Payer: Medicaid Other | Admitting: Cardiovascular Disease

## 2022-05-11 DIAGNOSIS — R351 Nocturia: Secondary | ICD-10-CM

## 2022-05-11 DIAGNOSIS — G4733 Obstructive sleep apnea (adult) (pediatric): Secondary | ICD-10-CM

## 2022-05-11 DIAGNOSIS — I480 Paroxysmal atrial fibrillation: Secondary | ICD-10-CM | POA: Diagnosis not present

## 2022-05-11 DIAGNOSIS — I10 Essential (primary) hypertension: Secondary | ICD-10-CM

## 2022-05-11 DIAGNOSIS — K219 Gastro-esophageal reflux disease without esophagitis: Secondary | ICD-10-CM

## 2022-05-11 NOTE — Progress Notes (Signed)
Cardiology Office Note    Date:  05/23/2022   ID:  Terri Robbins, DOB [birth year], MRN [id number]  PCP:  Spero Geralds, MD  Cardiologist:  Nicki Guadalajara, MD (sleep); Gypsy Balsam  [facility] consult   History of Present Illness:  Terri Robbins is a 31 y.o. female who is followed by Dr. Bing Matter for cardiology care.  She has a history of morbid obesity and and had an initial occurrence of atrial fibrillation when sick.  She has noticed dyspnea on exertion and had undergone an echo Doppler study in December 2022 which showed normal LV function with EF 55 to 60% without wall motion abnormalities.  Valves were essentially normal.  With her atrial fibrillation history and risk factors including morbid obesity and snoring she was referred by Dr. Bing Matter and Alphonzo Severance, PA for a sleep evaluation to assess for obstructive sleep apnea.  A home sleep study was completed on November 18, 2022 which revealed mild overall sleep apnea with an AHI of 14.1/h.  She snored for 191.1 minutes representing 49.9% of her sleep.  The severity during REM sleep was not able to be assessed on that home study.  Although therapeutic CPAP titration was ideally recommended, she ultimately underwent AutoPap therapy with initial settings at 6 to 18 cm of water with an EPR of 3.  Her CPAP set up date was February 24, 2022 when she received a ResMed air sense 11 AutoSet unit with [employer] as her DME company.  Prior to initiating CPAP therapy she had significant fatigue, frequent awakenings, nonrestorative sleep, GERD, as well as frequent nocturia.  She typically goes to bed at 9 PM but often stays in bed watching television and typically does not go to sleep until approximately 10:30 and wakes up at 5:45 AM. Initial download was obtained from March 21 through March 25, 2022.  She is compliant with use at 83%.  Average usage is 6 hours and 1 minute.  At her pressure range of 6 to 18 cm of water, AHI is excellent at  0.1/h.  Her 95th percentile pressure is 11.8 with a maximum average pressure of 13.2 cm of water.  Since initiating CPAP, she feels significantly improved.  She denies any breakthrough snoring.  Her blood pressure has improved.  She denies any awareness of recurrent arrhythmia or palpitations.  Her GERD nocturnal symptoms are improved and her nocturia is less.  Of note, she had undergone tonsillectomy and had UPPP  surgery at age 65.  Presently she is on metoprolol tartrate 25 mg twice a day, Linzess 2090 mcg daily vitamin D and takes as needed Tylenol.  An Epworth Sleepiness Scale score was calculated in the office today and this endorsed at 7 as shown below:   Epworth Sleepiness Scale: Situation   Chance of Dozing/Sleeping (0 = never , 1 = slight chance , 2 = moderate chance , 3 = high chance )   sitting and reading 2   watching TV 1   sitting inactive in a public place 0   being a passenger in a motor vehicle for an hour or more 1   lying down in the afternoon 2   sitting and talking to someone 0   sitting quietly after lunch (no alcohol) 1   while stopped for a few minutes in traffic as the driver 0   Total Score  7    She denies any breakthrough snoring, daytime sleepiness, hypersomnolence, bruxism, restless legs, hypnogognic hallucinations, no cataplexy.  She  presents for her initial sleep consultation and evaluation.  Past medical history reviewed, no pertinent history.  Past Surgical History:  Procedure Laterality Date   TONSILLECTOMY      Current Medications: Outpatient Medications Prior to Visit  Medication Sig Dispense Refill   ergocalciferol (VITAMIN D2) 1.25 MG (50000 UT) capsule Take 1 capsule by mouth every 3 (three) days.     LINZESS 290 MCG CAPS capsule Take 290 mcg by mouth daily.     metoprolol tartrate (LOPRESSOR) 25 MG tablet Take 1 tablet (25 mg total) by mouth 2 (two) times daily. 180 tablet 1   acetaminophen (TYLENOL) 500 MG tablet Take 1 tablet (500 mg total)  by mouth every 6 (six) hours as needed. (Patient not taking: Reported on 05/11/2022) 30 tablet 0   No facility-administered medications prior to visit.     Allergies:   Penicillins and Zithromax [azithromycin]   Social History   Socioeconomic History   Marital status: Single    Spouse name: Not on file   Number of children: Not on file   Years of education: Not on file   Highest education level: Not on file  Occupational History   Not on file  Tobacco Use   Smoking status: Every Day    Types: Cigars   Smokeless tobacco: Never   Tobacco comments:    1.5 black and milds daily 12/03/21  Vaping Use   Vaping Use: Never used  Substance and Sexual Activity   Alcohol use: Yes    Alcohol/week: 3.0 standard drinks of alcohol    Types: 3 Standard drinks or equivalent per week    Comment: 3 mixed drinks socially 12/03/21   Drug use: No   Sexual activity: Never    Birth control/protection: Implant  Other Topics Concern   Not on file  Social History Narrative   Not on file   Social Determinants of Health   Financial Resource Strain: Not on file  Food Insecurity: Not on file  Transportation Needs: Not on file  Physical Activity: Not on file  Stress: Not on file  Social Connections: Not on file    Socially she was born in [place].  She is engaged.  She has 2 children ages 64 and 80.  She works in Advice worker at [employer].  Family History:  The patient's family history includes Arrhythmia in her mother; COPD in her mother; Heart Problems in her mother.  Her father is alive at age 63 and mother at 43.  Her father has COPD and sleep apnea.  A brother has sleep apnea.  ROS General: Negative; No fevers, chills, or night sweats; super morbid obesity HEENT: Negative; No changes in vision or hearing, sinus congestion, difficulty swallowing Status post tonsillectomy and UPPP surgery at age 14 Pulmonary: Negative; No cough, wheezing, shortness of breath,  hemoptysis Cardiovascular: Isolated episode of atrial fibrillation; no chest pain, PND orthopnea. GI: Negative; No nausea, vomiting, diarrhea, or abdominal pain GU: Negative; No dysuria, hematuria, or difficulty voiding Musculoskeletal: Negative; no myalgias, joint pain, or weakness Hematologic/Oncology: Negative; no easy bruising, bleeding Endocrine: Negative; no heat/cold intolerance; no diabetes Neuro: Negative; no changes in balance, headaches Skin: Negative; No rashes or skin lesions Psychiatric: Negative; No behavioral problems, depression Sleep: Positive for OSA, see HPI negative;  Other comprehensive 14 point system review is negative.   PHYSICAL EXAM:   VS:  BP 120/80 (BP Location: Left Arm)   Pulse 73   Ht 5' 9.5" (1.765 m)  Wt (!) 358 lb 9.6 oz (162.7 kg)   SpO2 98%   BMI 52.20 kg/m     Repeat blood pressure by me was 118/76  Wt Readings from Last 3 Encounters:  05/11/22 (!) 358 lb 9.6 oz (162.7 kg)  01/28/22 (!) 361 lb (163.7 kg)  01/20/22 (!) 359 lb (162.8 kg)    General: Alert, oriented, no distress.  Skin: normal turgor, no rashes, warm and dry HEENT: Normocephalic, atraumatic. Pupils equal round and reactive to light; sclera anicteric; extraocular muscles intact; Nose without nasal septal hypertrophy Mouth/Paryn: Status post UPPP surgery Neck: No JVD, no carotid bruits; normal carotid upstroke Lungs: clear to ausculatation and percussion; no wheezing or rales Chest wall: without tenderness to palpitation Heart: PMI not displaced, RRR, s1 s2 normal, 1/6 systolic murmur, no diastolic murmur, no rubs, gallops, thrills, or heaves Abdomen: Central adiposity soft, nontender; no hepatosplenomehaly, BS+; abdominal aorta nontender and not dilated by palpation. Back: no CVA tenderness Pulses 2+ Musculoskeletal: full range of motion, normal strength, no joint deformities Extremities: no clubbing cyanosis or edema, Homan's sign negative  Neurologic: grossly nonfocal;  Cranial nerves grossly wnl Psychologic: Normal mood and affect   Studies/Labs Reviewed:   EKG:  EKG is ordered today.  ECG (independently read by me):  NSR at 73; no ectopy, PR 168 msec  Recent Labs:    Latest Ref Rng & Units 10/31/2021   10:30 AM 12/21/2018    5:28 PM 12/27/2014    4:30 PM  BMP  Glucose 70 - 99 mg/dL 102  87  90   BUN 6 - 20 mg/dL $Remove'13  13  12   'Ygvushx$ Creatinine 0.44 - 1.00 mg/dL 0.58  0.67  0.67   Sodium 135 - 145 mmol/L 138  136  137   Potassium 3.5 - 5.1 mmol/L 4.0  3.8  3.3   Chloride 98 - 111 mmol/L 107  105  108   CO2 22 - 32 mmol/L $RemoveB'25  25  23   'mkisvRUF$ Calcium 8.9 - 10.3 mg/dL 8.6  8.8  9.1         Latest Ref Rng & Units 10/31/2021   10:30 AM 12/27/2014    4:30 PM  Hepatic Function  Total Protein 6.5 - 8.1 g/dL 7.2  7.9   Albumin 3.5 - 5.0 g/dL 3.4  4.1   AST 15 - 41 U/L 13  17   ALT 0 - 44 U/L 14  18   Alk Phosphatase 38 - 126 U/L 56  65   Total Bilirubin 0.3 - 1.2 mg/dL 0.2  0.4        Latest Ref Rng & Units 10/31/2021   10:30 AM 12/21/2018    5:28 PM 12/27/2014    4:30 PM  CBC  WBC 4.0 - 10.5 K/uL 12.9  8.7  8.6   Hemoglobin 12.0 - 15.0 g/dL 14.9  12.5  13.9   Hematocrit 36.0 - 46.0 % 46.3  41.8  44.0   Platelets 150 - 400 K/uL 317  242  280    Lab Results  Component Value Date   MCV 83.6 10/31/2021   MCV 85.3 12/21/2018   MCV 78.0 12/27/2014   Lab Results  Component Value Date   TSH 1.592 10/31/2021   No results found for: "HGBA1C"   BNP No results found for: "BNP"  ProBNP No results found for: "PROBNP"   Lipid Panel  No results found for: "CHOL", "TRIG", "HDL", "CHOLHDL", "VLDL", "LDLCALC", "LDLDIRECT", "LABVLDL"   RADIOLOGY: No results  found.   Additional studies/ records that were reviewed today include:   01/19/2022 CLINICAL INFORMATION Sleep Study Type: HST   Indication for sleep study: Snoring, daytime somnolence, AF, morbid obesity   Epworth Sleepiness Score:  6   SLEEP STUDY TECHNIQUE A multi-channel overnight  portable sleep study was performed. The channels recorded were: nasal airflow, thoracic respiratory movement, and oxygen saturation with a pulse oximetry. Snoring was also monitored.   MEDICATIONS acetaminophen (TYLENOL) 500 MG tablet ergocalciferol (VITAMIN D2) 1.25 MG (50000 UT) capsule LINZESS 290 MCG CAPS capsule metoprolol tartrate (LOPRESSOR) 25 MG tablet  Patient self administered medications include: N/A.   SLEEP ARCHITECTURE Patient was studied for 383 minutes. The sleep efficiency was 100.0 % and the patient was supine for 10.7%. The arousal index was 0.0 per hour.   RESPIRATORY PARAMETERS The overall AHI was 14.1 per hour, with a central apnea index of 0 per hour.   The oxygen nadir was 90% during sleep.   CARDIAC DATA Mean heart rate during sleep was 81.1 bpm.   IMPRESSIONS - Mild obstructive sleep apnea occurred during this study (AHI 14.1/h). - The patient had no oxygen desaturation during the study (Min O2 90%) - Patient snored for 191.1 minutes (49.9%)  during the sleep.   DIAGNOSIS - Obstructive Sleep Apnea (G47.33)   RECOMMENDATIONS - In this patient with significant cardiovascular co-morbidities recommend therapeutic CPAP titration to determine optimal pressure required to alleviate sleep disordered breathing. If unbale for an in-lab titration initiate Auto - PAP with EPR of 3 at 6 - 18 cm of water. - Effort should be made to optimize nasal and oropharyngeal patency. - Avoid alcohol, sedatives and other CNS depressants that may worsen sleep apnea and disrupt normal sleep architecture. - Sleep hygiene should be reviewed to assess factors that may improve sleep quality. - Weight management (BMI 53) and regular exercise should be initiated or continued. - Recommend a download and sleep clinic evaluation after one month of therapy.   ASSESSMENT:    1. Obstructive sleep apnea   2. Paroxysmal atrial fibrillation (HCC)   3. Morbid obesity ([place])   4. Nocturia    5. Gastroesophageal reflux disease without esophagitis   6. Essential hypertension     PLAN:  Terri Robbins is a very pleasant 31 year old female who has a history of morbid obesity with current BMI 52.2.  At age 31 years old she underwent tonsillectomy and UPPP surgery.  She has family history for sleep apnea both in her father and brother and has noted mild blood pressure elevation and had developed an episode of atrial fibrillation with rapid ventricular rate in the setting when she had cold like symptoms.  Her CHA2DS2-VASc score was 0 and consequently she was not anticoagulated after he converted to sinus rhythm.  She had symptoms highly suggestive of obstructive sleep apnea with snoring, mild blood pressure lability, frequent awakenings, nocturnal GERD and nocturia 3-4 times per night.  She was referred for a sleep study and was found to have mild to moderate obstructive sleep apnea with an overall AHI of 14.1/h despite having previously undergone UPPP surgery.  She snored for approximately 50% of the night.  She subsequently initiated CPAP therapy with auto PAP with EPR of 3 and initial pressure settings at 6 to 18 cm of water.  Her initial download confirms that she is meeting compliance standards but usage was only 6 hours and 1 minute per night.  95th percentile pressure was 11.8 with maximum average pressure  13.2 and AHI was 0.1.  A subsequent download was obtained from May 4 through June 6 01/2022.  Again compliance is excellent as far as use and usage greater than 4 hours but sleep duration remains suboptimal at 5 hours and 50 minutes.  AHI is excellent at 0/h with a 95th percentile pressure 12.3 and maximum average pressure 13.9.  During this initial sleep consultation, I had a lengthy discussion with the patient.  We discussed how untreated sleep apnea can contribute to some blood pressure lability particularly with increased sympathetic tone resulting from arousals disrupting the typical  parasympathetic response of non-REM sleep.  In addition we discussed potential for nocturnal arrhythmias, and increased risk for atrial fibrillation.  I discussed its implications with reference to increased insulin resistance, increased inflammation, and potential increased nocturnal GERD.  She did not have significant oxygen desaturation on her initial home study but I also discussed potential nocturnal hypoxemia contributing to ischemia both cardiac as well as cerebrovascular.  I discussed the importance of weight loss.  Presently her BMI is 52.2.  We discussed the importance of weight loss and exercise.  She has noticed a dramatic benefit since initiating therapy.  She has more energy.  She is unaware of any breakthrough snoring.  Her blood pressure is now stable without previous lability her GERD is reduced as well as is her nocturia.  Presently I will change her CPAP setting to 8 range of 8 to 18 cm based on her 95th percentile data.  I commended her on her compliance but stressed the importance of ideal sleep duration at 7 and 9 hours.  I will be available as problems arise.  She will return to the care of Dr. Agustin Cree.  Time spent: 40 minutes  Medication Adjustments/Labs and Tests Ordered: Current medicines are reviewed at length with the patient today.  Concerns regarding medicines are outlined above.  Medication changes, Labs and Tests ordered today are listed in the Patient Instructions below. Patient Instructions  Medication Instructions:  Your physician recommends that you continue on your current medications as directed. Please refer to the Current Medication list given to you today.  Follow-Up: At [facility], you and your health needs are our priority.  As part of our continuing mission to provide you with exceptional heart care, we have created designated Provider Care Teams.  These Care Teams include your primary Cardiologist (physician) and Advanced Practice Providers (APPs -   Physician Assistants and Nurse Practitioners) who all work together to provide you with the care you need, when you need it.  We recommend signing up for the patient portal called "MyChart".  Sign up information is provided on this After Visit Summary.  MyChart is used to connect with patients for Virtual Visits (Telemedicine).  Patients are able to view lab/test results, encounter notes, upcoming appointments, etc.  Non-urgent messages can be sent to your provider as well.   To learn more about what you can do with MyChart, go to [email or web address].    Your next appointment:   AS NEEDED with Dr. Claiborne Billings    Important Information About Sugar         Signed, Shelva Majestic, MD, [facility], [place], American Board of Sleep Medicine  05/23/2022 4:00 PM    [facility] [address] [place], [place], [place]  [address] Phone: [phone]

## 2022-05-11 NOTE — Patient Instructions (Signed)
Medication Instructions:  Your physician recommends that you continue on your current medications as directed. Please refer to the Current Medication list given to you today.  Follow-Up: At [facility], you and your health needs are our priority.  As part of our continuing mission to provide you with exceptional heart care, we have created designated Provider Care Teams.  These Care Teams include your primary Cardiologist (physician) and Advanced Practice Providers (APPs -  Physician Assistants and Nurse Practitioners) who all work together to provide you with the care you need, when you need it.  We recommend signing up for the patient portal called "MyChart".  Sign up information is provided on this After Visit Summary.  MyChart is used to connect with patients for Virtual Visits (Telemedicine).  Patients are able to view lab/test results, encounter notes, upcoming appointments, etc.  Non-urgent messages can be sent to your provider as well.   To learn more about what you can do with MyChart, go to [email or web address].    Your next appointment:   AS NEEDED with Dr. Claiborne Billings    Important Information About Sugar

## 2022-05-23 ENCOUNTER — Encounter: Payer: Self-pay | Admitting: Cardiovascular Disease

## 2022-06-19 ENCOUNTER — Other Ambulatory Visit (HOSPITAL_COMMUNITY): Payer: Self-pay | Admitting: Physician Assistant

## 2022-08-06 ENCOUNTER — Other Ambulatory Visit: Payer: Self-pay

## 2022-08-06 DIAGNOSIS — Z8679 Personal history of other diseases of the circulatory system: Secondary | ICD-10-CM

## 2022-08-11 ENCOUNTER — Ambulatory Visit: Payer: Medicaid Other | Attending: Cardiology | Admitting: Cardiology

## 2023-08-18 ENCOUNTER — Other Ambulatory Visit (HOSPITAL_COMMUNITY): Payer: Self-pay | Admitting: Cardiology

## 2023-12-03 ENCOUNTER — Ambulatory Visit: Payer: Medicaid Other | Attending: Cardiology | Admitting: Cardiology

## 2023-12-03 ENCOUNTER — Encounter: Payer: Self-pay | Admitting: Cardiology

## 2023-12-03 VITALS — BP 124/82 | HR 83 | Ht 69.5 in | Wt 374.0 lb

## 2023-12-03 DIAGNOSIS — E7800 Pure hypercholesterolemia, unspecified: Secondary | ICD-10-CM | POA: Diagnosis not present

## 2023-12-03 DIAGNOSIS — I480 Paroxysmal atrial fibrillation: Secondary | ICD-10-CM

## 2023-12-03 NOTE — Patient Instructions (Addendum)
Medication Instructions:  Your physician recommends that you continue on your current medications as directed. Please refer to the Current Medication list given to you today.  *If you need a refill on your cardiac medications before your next appointment, please call your pharmacy*   Lab Work: None Ordered If you have labs (blood work) drawn today and your tests are completely normal, you will receive your results only by: MyChart Message (if you have MyChart) OR A paper copy in the mail If you have any lab test that is abnormal or we need to change your treatment, we will call you to review the results.   Testing/Procedures: None Ordered   Follow-Up: At [facility], you and your health needs are our priority.  As part of our continuing mission to provide you with exceptional heart care, we have created designated Provider Care Teams.  These Care Teams include your primary Cardiologist (physician) and Advanced Practice Providers (APPs -  Physician Assistants and Nurse Practitioners) who all work together to provide you with the care you need, when you need it.  We recommend signing up for the patient portal called "MyChart".  Sign up information is provided on this After Visit Summary.  MyChart is used to connect with patients for Virtual Visits (Telemedicine).  Patients are able to view lab/test results, encounter notes, upcoming appointments, etc.  Non-urgent messages can be sent to your provider as well.   To learn more about what you can do with MyChart, go to [email or web address].    Your next appointment:   12 month(s)  The format for your next appointment:   In Person  Provider:   Gypsy Balsam, MD    Other Instructions  FDA-cleared personal EKG: The world's most clinically validated personal EKG, FDA-cleared to detect Atrial Fibrillation, Bradycardia, and Tachycardia. Mayford Knife is the most reliable way to check in on your heart from home. Take your EKG from  anywhere: Capture a medical-grade EKG in 30 seconds and get an instant analysis right on your smartphone. Mayford Knife is small enough to fit in your pocket, so you can take it with you anywhere. Easy to use: Simply place your fingers on the sensors--no wires, patches, or gels. Recommended by doctors: A trusted resource, Mayford Knife is the #1 doctor-recommended personal EKG with more than 100 million EKGs recorded. Save or share your EKGs: With the press of a button, email your EKGs to your doctor or save them on your phone. Works with smartphones: Compatible with Event organiser and tablets. Check our compatibility chart. FSA/HSA eligible: Purchase using an FSA or HSA account (please confirm coverage with your insurance provider). Phone clip included with purchase, a $15 value. Conveniently take your device with you wherever you go.  [email or web address]   Step One- Record your EKG strip on [facility] app.   Step two- On K[facility]ia EKG click "Download"   Step three- It will prompt you to make a password for this EKG. Please make the password "[email or web address]" so that we can view it.   Step four- Click on the little "upload" button (small box with an arrow in the middle) in the bottom left-hand corner of the screen.   Step five- Click "Save to Files"  Step six- Click on "On my iphone" and then "Pages" then press save in the top right-hand corner.   NOW GO TO MYCHART   Once on MyChart click "Messages"  Step one- Click "Send a message"  Step two- Click "Ask a medical question"  Step three- Click "Non urgent medical question"   Step four- Click on Rajan Revankar's name.  Step five- Click on the small paperclip at the bottom of the screen  Step six- Click "Choose file"  Step seven- Pick the most recent EKG strip listed.   Once uploaded send the message!

## 2023-12-03 NOTE — Progress Notes (Unsigned)
Cardiology Office Note:    Date:  12/03/2023   ID:  Terri Robbins, DOB Jun 17, 1991, MRN [id number]  PCP:  Spero Geralds, MD  Cardiologist:  Gypsy Balsam, MD    Referring MD: Bevelyn Ngo*   Chief Complaint  Patient presents with   Follow-up    History of Present Illness:    Terri Robbins is a 32 y.o. female with history of 1 documented episode of atrial fibrillation.  Comes today to months for follow-up.  She described to have some rare palpitations maybe once a month lasting for up to half an hour and if she feels some speeding up of her heart rate and skipped beats.  Otherwise doing fine.  No chest pain tightness squeezing pressure burning chest.  She is trying to work on weight loss and she is going to the gym twice a week.  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 (Patient taking differently: Take 500 mg by mouth every 6 (six) hours as needed for mild pain (pain score 1-3) or moderate pain (pain score 4-6).)   Cholecalciferol (VITAMIN D3) 1.25 MG (50000 UT) CAPS Take 1 capsule by mouth once a week.   clindamycin (CLEOCIN) 300 MG capsule Take 300 mg by mouth 4 (four) times daily.   fluticasone (FLONASE) 50 MCG/ACT nasal spray Place 1 spray into both nostrils every 12 (twelve) hours.   hydrochlorothiazide (HYDRODIURIL) 25 MG tablet Take 1 tablet by mouth daily.   LINZESS 290 MCG CAPS  capsule Take 290 mcg by mouth daily.   metoprolol tartrate (LOPRESSOR) 25 MG tablet TAKE 1 TABLET(25 MG) BY MOUTH TWICE DAILY (Patient taking differently: Take 25 mg by mouth 2 (two) times daily.)   rosuvastatin (CRESTOR) 10 MG tablet Take 1 tablet by mouth daily.   Vitamin D, Ergocalciferol, (DRISDOL) 1.25 MG (50000 UNIT) CAPS capsule Take 1 capsule by mouth once a week.     Allergies:   Penicillins and Zithromax [azithromycin]   Social History   Socioeconomic History   Marital status: Single    Spouse name: Not on file   Number of children: Not on file   Years of education: Not on file   Highest education level: Not on file  Occupational History   Not on file  Tobacco Use   Smoking status: Every Day    Types: Cigars   Smokeless tobacco: Never   Tobacco comments:    1.5 black and milds daily 12/03/21  Vaping Use   Vaping status: Never Used  Substance and Sexual Activity   Alcohol use: Yes    Alcohol/week: 3.0 standard drinks of alcohol    Types: 3 Standard drinks or equivalent per week    Comment: 3 mixed drinks socially 12/03/21   Drug use: No   Sexual activity: Never    Birth control/protection: Implant  Other Topics Concern   Not on file  Social History Narrative   Not on file  Social Drivers of Corporate investment banker Strain: Not on file  Food Insecurity: Low Risk  (10/28/2023)   Received from [facility]   Hunger Vital Sign    Worried About Running Out of Food in the Last Year: Never true    Ran Out of Food in the Last Year: Never true  Transportation Needs: No Transportation Needs (10/28/2023)   Received from [facility]    In the past 12 months, has lack of reliable transportation kept you from medical appointments, meetings, work or from getting things needed for daily living? : No  Physical Activity: Not on file  Stress: Not on file  Social Connections: Unknown (04/21/2022)   Received from [facility]   Social  Network    Social Network: Not on file     Family History: The patient's family history includes Arrhythmia in her mother; COPD in her mother; Heart Problems in her mother. ROS:   Please see the history of present illness.    All 14 point review of systems negative except as described per history of present illness  EKGs/Labs/Other Studies Reviewed:    EKG Interpretation Date/Time:  Friday December 03 2023 16:17:07 EST Ventricular Rate:  84 PR Interval:  154 QRS Duration:  90 QT Interval:  364 QTC Calculation: 430 R Axis:   -40  Text Interpretation: Normal sinus rhythm Left axis deviation Left ventricular hypertrophy ( R in aVL , Romhilt-Estes ) Nonspecific T wave abnormality When compared with ECG of 03-Dec-2021 15:14, QRS axis Shifted left Nonspecific T wave abnormality now evident in Anterolateral leads Confirmed by Gypsy Balsam [phone]) on 12/03/2023 4:22:20 PM    Recent Labs: No results found for requested labs within last 365 days.  Recent Lipid Panel No results found for: "CHOL", "TRIG", "HDL", "CHOLHDL", "VLDL", "LDLCALC", "LDLDIRECT"  Physical Exam:    VS:  BP 124/82 (BP Location: Right Arm, Patient Position: Sitting)   Pulse 83   Ht 5' 9.5" (1.765 m)   Wt (!) 374 lb (169.6 kg)   SpO2 98%   BMI 54.44 kg/m     Wt Readings from Last 3 Encounters:  12/03/23 (!) 374 lb (169.6 kg)  05/11/22 (!) 358 lb 9.6 oz (162.7 kg)  01/28/22 (!) 361 lb (163.7 kg)     GEN:  Well nourished, well developed in no acute distress HEENT: Normal NECK: No JVD; No carotid bruits LYMPHATICS: No lymphadenopathy CARDIAC: RRR, no murmurs, no rubs, no gallops RESPIRATORY:  Clear to auscultation without rales, wheezing or rhonchi  ABDOMEN: Soft, non-tender, non-distended MUSCULOSKELETAL:  No edema; No deformity  SKIN: Warm and dry LOWER EXTREMITIES: no swelling NEUROLOGIC:  Alert and oriented x 3 PSYCHIATRIC:  Normal affect   ASSESSMENT:    1. Paroxysmal atrial fibrillation  (HCC)   2. Hypercholesterolemia   3. Morbid obesity (HCC)    PLAN:    In order of problems listed above:  Paroxysmal atrial fibrillation.  CHADS2 vascular 0.  Not anticoagulated described to have some palpitations I tried to put monitor on her she prefers Lourena Sim[employer]s mobile I will give her information about it so she can get the device and record her arrhythmia. Hypercholesterolemia I do not have any recent fasting lipid profile will request from primary care physician. Morbid obesity obviously significant problem but she is making some effort to lose weight which hopefully will work   Medication Adjustments/Labs and Tests Ordered: Current medicines are reviewed at length with the patient today.  Concerns  regarding medicines are outlined above.  Orders Placed This Encounter  Procedures   EKG 12-Lead   Medication changes: No orders of the defined types were placed in this encounter.   Signed, Georgeanna Lea, MD, [facility] 12/03/2023 4:29 PM    [facility]

## 2024-08-28 ENCOUNTER — Other Ambulatory Visit: Payer: Self-pay | Admitting: Cardiology

## 2024-11-20 ENCOUNTER — Encounter: Payer: Self-pay | Admitting: *Deleted

## 2024-11-20 ENCOUNTER — Ambulatory Visit: Attending: Cardiology | Admitting: Cardiology

## 2024-11-24 ENCOUNTER — Other Ambulatory Visit: Payer: Self-pay | Admitting: Cardiology
